# Patient Record
Sex: Male | Born: 1950
Health system: Southern US, Community
[De-identification: ages and names within clinical notes are randomized; demographics above are authoritative.]

## PROBLEM LIST (undated history)

## (undated) DIAGNOSIS — I2510 Atherosclerotic heart disease of native coronary artery without angina pectoris: Secondary | ICD-10-CM

## (undated) DIAGNOSIS — E119 Type 2 diabetes mellitus without complications: Secondary | ICD-10-CM

## (undated) DIAGNOSIS — K219 Gastro-esophageal reflux disease without esophagitis: Secondary | ICD-10-CM

## (undated) DIAGNOSIS — F419 Anxiety disorder, unspecified: Secondary | ICD-10-CM

## (undated) DIAGNOSIS — E7800 Pure hypercholesterolemia, unspecified: Secondary | ICD-10-CM

## (undated) HISTORY — PX: ORIF SHOULDER FRACTURE: SHX5035

## (undated) HISTORY — PX: PARS PLANA VITRECTOMY W/ REPAIR OF MACULAR HOLE: SHX2170

## (undated) HISTORY — PX: KNEE ARTHROSCOPY: SHX127

## (undated) HISTORY — PX: EYE SURGERY: SHX253

## (undated) HISTORY — PX: CATARACT EXTRACTION W/ INTRAOCULAR LENS  IMPLANT, BILATERAL: SHX1307

## (undated) HISTORY — PX: FRACTURE SURGERY: SHX138

---

## 1999-02-18 ENCOUNTER — Ambulatory Visit (HOSPITAL_COMMUNITY): Admission: RE | Admit: 1999-02-18 | Discharge: 1999-02-18 | Payer: Self-pay | Admitting: Internal Medicine

## 2003-05-13 ENCOUNTER — Ambulatory Visit (HOSPITAL_COMMUNITY): Admission: RE | Admit: 2003-05-13 | Discharge: 2003-05-13 | Payer: Self-pay | Admitting: Gastroenterology

## 2006-02-13 ENCOUNTER — Ambulatory Visit (HOSPITAL_COMMUNITY): Admission: RE | Admit: 2006-02-13 | Discharge: 2006-02-13 | Payer: Self-pay | Admitting: Ophthalmology

## 2006-04-28 ENCOUNTER — Encounter: Admission: RE | Admit: 2006-04-28 | Discharge: 2006-04-28 | Payer: Self-pay | Admitting: Internal Medicine

## 2007-10-15 ENCOUNTER — Ambulatory Visit (HOSPITAL_COMMUNITY): Admission: RE | Admit: 2007-10-15 | Discharge: 2007-10-15 | Payer: Self-pay | Admitting: Ophthalmology

## 2010-10-12 NOTE — Op Note (Signed)
NAMEAPRIL, CARLYON      ACCOUNT NO.:  [id number]   MEDICAL RECORD NO.:  [id number]          PATIENT TYPE:  AMB   LOCATION:  SDS                          FACILITY:  [facility]   PHYSICIAN:  Alford Highland. Rankin, M.D.   DATE OF BIRTH:  10/07/1950   DATE OF PROCEDURE:  10/15/2007  DATE OF DISCHARGE:  10/15/2007                               OPERATIVE REPORT   PREOPERATIVE DIAGNOSIS:  Macular hole - stage III left eye, status post  reopening and failed, primary surgery almost 1 year previous.   POSTOPERATIVE DIAGNOSIS:  Macular hole - stage III left eye, status post  reopening and failed, primary surgery almost 1 year previous.   PROCEDURE:  1. Posterior vitrectomy with internal limiting membrane peel with ICG      guidance left eye - 25 gauge.  2. Injection of vitreous substance C3F8 10% left eye.   SURGEON:  Alford Highland. Rankin, MD.   ANESTHESIA:  Local as per my anesthesia control.   INDICATIONS FOR PROCEDURE:  The patient is a 60 year old  male who has  profound vision loss in the left eye, which is chronic, persistent, and  debilitating in the left eye.  The patient states this is an attempt to  discover if there is any remnants of any internal-limiting membrane that  might be near the center of the vision and to peel these so that there  is hopefully enough contractility and enough spontaneous closure of the  macular hole after removal of the internal-limiting membrane was  confirmed to be completed.  The patient understands the risks of  anesthesia including the recurrence of death, loss of the eye including,  but not limited to hemorrhage, infection, scarring, need further  surgery, no change in vision, loss of vision, progressive disease  despite intervention.  Appropriate signed consent was obtained.   PROCEDURE IN DETAIL:  The patient was taken to the operating room.  In  the operating room, appropriate monitors followed by mild sedation.  Xylocaine 2% injected retrobulbar,  5 mL left eye, with additional 5 mL  laterally in the fashion of modified Darel Hong. The left periocular  region was then sterilely prepped and draped in the usual sterile  fashion. Lid speculum was applied.  A 25-gauge trocar was placed in the  inferotemporal quadrant.  The infusion turned on.  Superior trocars  applied.  Vitrectomy instrumentation applied and vitreous skirt was  trimmed further.  Fluid-air exchange completed almost immediately,  because the previous vitreous core had been removed.  Dilute 1 mL of ICG  with 6 mL of BSS was then placed over the posterior pole. This was  immediately aspirated and removed.  Staining of the internal limiting  membrane was noted at least over 1 disc diameter away from the edges of  the  macular hole 360 degrees.  However, when this was grasped nasally  and over the papillomacular bundle area of the continuous tear disclosed  that there may have been a small amount of internal limiting membrane  that encroached slightly closer to the fovea but no edges seem to be on  the edges of the macular hole or attached  to the macular hole.  There  may have been some remnants of cortical vitreous obscuring and staining  over the papillomacular bundle.  Nonetheless, the internal limiting  membrane was then peeled off between the entire foveal and optic nerve  region on the temporal aspect of the nerve and for another disc diameter  of 360 degrees circumferentially.  Leaping up over a 2-disc diameter  circle without internal limiting membrane.  At this time, fluid-air  exchange completed.  It must be noted that the all the dissection of  internal limiting membrane had been done under fluid.  Fluid exchange  now completed.  Under air, it is clear that the mobile edges of the  macular hole were noted.  Incision was made to place a long-acting C3F8  gas because of the patient's body habitus makes it difficult for  positioning.   An air-C3F8 10% exchange  completed.  The superior trocars were removed.  The infusion removed.  Instruments removed from the eye.  The infusion  removed. Subconjunctival with Decadron was applied.  Intraocular  pressures were assessed and were adequate.  Sterile patch and Fox shield  were applied.  The patient was taken to short-stay area to be discharged  home as an outpatient.      Alford Highland Rankin, M.D.  Electronically Signed     GAR/MEDQ  D:  10/15/2007  T:  10/16/2007  Job:  [id number]

## 2010-10-15 NOTE — Op Note (Signed)
Connor Wright, Connor Wright      ACCOUNT NO.:  [id number]   MEDICAL RECORD NO.:  [id number]          PATIENT TYPE:  AMB   LOCATION:  SDS                          FACILITY:  [facility]   PHYSICIAN:  Alford Highland. Rankin, M.D.   DATE OF BIRTH:  [birth year]   DATE OF PROCEDURE:  02/13/2006  DATE OF DISCHARGE:                                 OPERATIVE REPORT   PREOPERATIVE DIAGNOSES:  1. Macular hole--stage III, left eye.  2. Afferent membrane, left eye.   PROCEDURE:  1. Posterior vitrectomy with membrane peel--internal limiting membrane,      left eye--25 gauge.  2. Injection of vitreous substitute C3F8 8%.   SURGEON:  Alford Highland. Rankin, M.D.   ANESTHESIA:  Local retrobulbar, monitored anesthesia control.   INDICATIONS:  The patient is a 60 year old man who has __________ vision  loss on the basis of idiopathic stage III macular hole.  The patient  understands this is an attempt to close the macular hole by removing  tractional forces around the macular hole.  He understands the risks of  anesthesia including __________ the eye, including but not limited to  hemorrhage, infection, scarring, need fur further surgery, no change in  vision, loss of vision and progressive disease despite intervention.  Appropriate signed consent was obtained.  The patient was taken to the  operating room.   DESCRIPTION OF PROCEDURE:  In the operating room, appropriate monitors  followed by mild sedation.  Marcaine 0.75% delivered 5 mL retrobulbar to the  left eye followed by additional 5 mL, the latter in the fashion of a  modified [employer]  Left periocular region sterilely prepped and draped in  the usual ophthalmic fashion.  Lid speculum applied.  A 25-gauge trocar  system used to place the infusion cannula.  Superior trocars were applied.  Core vitrectomy was then begun.  Notable finding of physician-induced  vitreous detachment was required and this was carried and moved anterior to  the equator.  There  did appear to be slight posterior pressure from the  orbital tissues, but no choroidal elevations.  Nonetheless, most of the  performance of this procedure was done under higher infusion pressures to  maintain the globe contour.   At this time, a 25-gauge forceps was then used to engage the epiretinal  tissue, which was removed, and then subsequently the internal limiting  membrane was engaged temporal to the fovea and it was removed as a  continuous sheet off the macular region with excellent visualization.   At this time, no complications occurred.  Fluid-air exchange completed.  An  AR-C3F8 8% exchange completed.  Superior trocars were removed under high  infusion pressure to again maintain the globe integrity.  At this time, the  infusion was removed.  Subconjunctival injection of Decadron applied.  A  sterile patch and Fox shield applied.  The patient taken to the short stay,  discharged home as an outpatient.      Alford Highland Rankin, M.D.  Electronically Signed     GAR/MEDQ  D:  02/13/2006  T:  02/14/2006  Job:  [id number]

## 2011-06-21 ENCOUNTER — Ambulatory Visit (INDEPENDENT_AMBULATORY_CARE_PROVIDER_SITE_OTHER): Payer: BC Managed Care – PPO

## 2011-06-21 DIAGNOSIS — R05 Cough: Secondary | ICD-10-CM

## 2011-06-21 DIAGNOSIS — J40 Bronchitis, not specified as acute or chronic: Secondary | ICD-10-CM

## 2011-06-21 DIAGNOSIS — R059 Cough, unspecified: Secondary | ICD-10-CM

## 2012-06-06 ENCOUNTER — Ambulatory Visit (INDEPENDENT_AMBULATORY_CARE_PROVIDER_SITE_OTHER): Payer: BC Managed Care – PPO | Admitting: Family Medicine

## 2012-06-06 ENCOUNTER — Ambulatory Visit: Payer: BC Managed Care – PPO

## 2012-06-06 VITALS — BP 123/73 | HR 94 | Temp 97.7°F | Resp 16 | Ht 67.0 in | Wt 213.0 lb

## 2012-06-06 DIAGNOSIS — M5380 Other specified dorsopathies, site unspecified: Secondary | ICD-10-CM

## 2012-06-06 DIAGNOSIS — M545 Low back pain, unspecified: Secondary | ICD-10-CM

## 2012-06-06 DIAGNOSIS — M62830 Muscle spasm of back: Secondary | ICD-10-CM

## 2012-06-06 MED ORDER — MELOXICAM 7.5 MG PO TABS: 7.5000 mg | ORAL_TABLET | Freq: Every day | ORAL | Status: DC

## 2012-06-06 MED ORDER — CYCLOBENZAPRINE HCL 5 MG PO TABS: 5.0000 mg | ORAL_TABLET | Freq: Every evening | ORAL | Status: DC | PRN

## 2012-06-06 NOTE — Patient Instructions (Signed)
You likely sprained the ligaments or strained the muscles in you low back.  You can try heat, gentle range of motion and stretches, and other exercises in back care manual as needed.  meloxicam once per day if needed, flexeril at bedtime if needed. Recheck here or your primary doctor if not improving in next 1 week.   Return to the clinic or go to the nearest emergency room if any of your symptoms worsen or new symptoms occur.

## 2012-06-06 NOTE — Progress Notes (Signed)
Subjective:    Patient ID: Connor Wright, male    DOB: 02-Jul-1950, 62 y.o.   MRN: [id number]  HPI Connor Wright is a 62 y.o. male  LBP - occasional soreness - usually improves with ibuprofen otc, on and off pain for years, but more sore for past 3-4 days. Noticed initally when getting up from bed.  Hard to straighten.  Worse in the morning - less during day. No bowel or bladder incontinence, no saddle anesthesia, no lower extremity weakness. Muscles sore in back. [employer], computer/desk work. Did have fall few days prior - fell onto hands, no known back pain then - didn;t notice pain in back for 3-4days.    Tx:  Ibuprofen, naporsyn , heat - temporary relief only.   Dr.  Priscille Kluver = ortho, had xrays few years ago.   Hx of R shoulder pain followed by ortho in past as well, including cortisone injection.  Flairs at times, hasn' t had chance to follow up with Dr. Priscille Kluver.   Hx of   Review of Systems  Genitourinary: Negative for difficulty urinating.  Musculoskeletal: Positive for myalgias and back pain.  Skin: Negative for rash.       Objective:   Physical Exam  Constitutional: He appears well-developed and well-nourished.  HENT:  Head: Normocephalic and atraumatic.  Neck: Normal range of motion.  Pulmonary/Chest: Effort normal.  Abdominal: Soft. There is no tenderness.  Musculoskeletal: He exhibits tenderness.       Right shoulder: He exhibits normal range of motion (but pain with flex/abduction.) and no bony tenderness.       Lumbar back: He exhibits tenderness and spasm. He exhibits normal range of motion and no bony tenderness.       Back:       Arms: Neurological: He is alert. He has normal strength. No sensory deficit. He displays no Babinski's sign on the right side. He displays no Babinski's sign on the left side.  Reflex Scores:      Patellar reflexes are 2+ on the right side and 2+ on the left side.      Achilles reflexes are 2+ on the right side and 2+ on  the left side.      Able to heel and toe walk without difficulty.  Skin: Skin is warm and dry.  Psychiatric: He has a normal mood and affect. His behavior is normal.      [facility] reading (PRIMARY) by  Dr. Neva Seat: LS spine:decreased lordosis, min spondylosis.      Assessment & Plan:  Connor Wright is a 62 y.o. male 1. Low back pain  DG Lumbar Spine 2-3 Views, meloxicam (MOBIC) 7.5 MG tablet, cyclobenzaprine (FLEXERIL) 5 MG tablet. Likely chronic LBP with flair. Sx care, back care manual, rtc precautions.   2. Muscle spasm of back  cyclobenzaprine (FLEXERIL) 5 MG tablet   l R shoulder pain - recurrent bursitis likely - follow up with ortho.  May improve with meds for back.   Patient Instructions  You likely sprained the ligaments or strained the muscles in you low back.  You can try heat, gentle range of motion and stretches, and other exercises in back care manual as needed.  meloxicam once per day if needed, flexeril at bedtime if needed. Recheck here or your primary doctor if not improving in next 1 week.   Return to the clinic or go to the nearest emergency room if any of your symptoms worsen or new symptoms occur.

## 2014-07-02 IMAGING — CR DG LUMBAR SPINE 2-3V
2 series · 2 of 2 positions shown · non-contrast
Comparison: None.

CLINICAL DATA: Low back pain

LUMBAR SPINE - 2-3 VIEW

[AP]
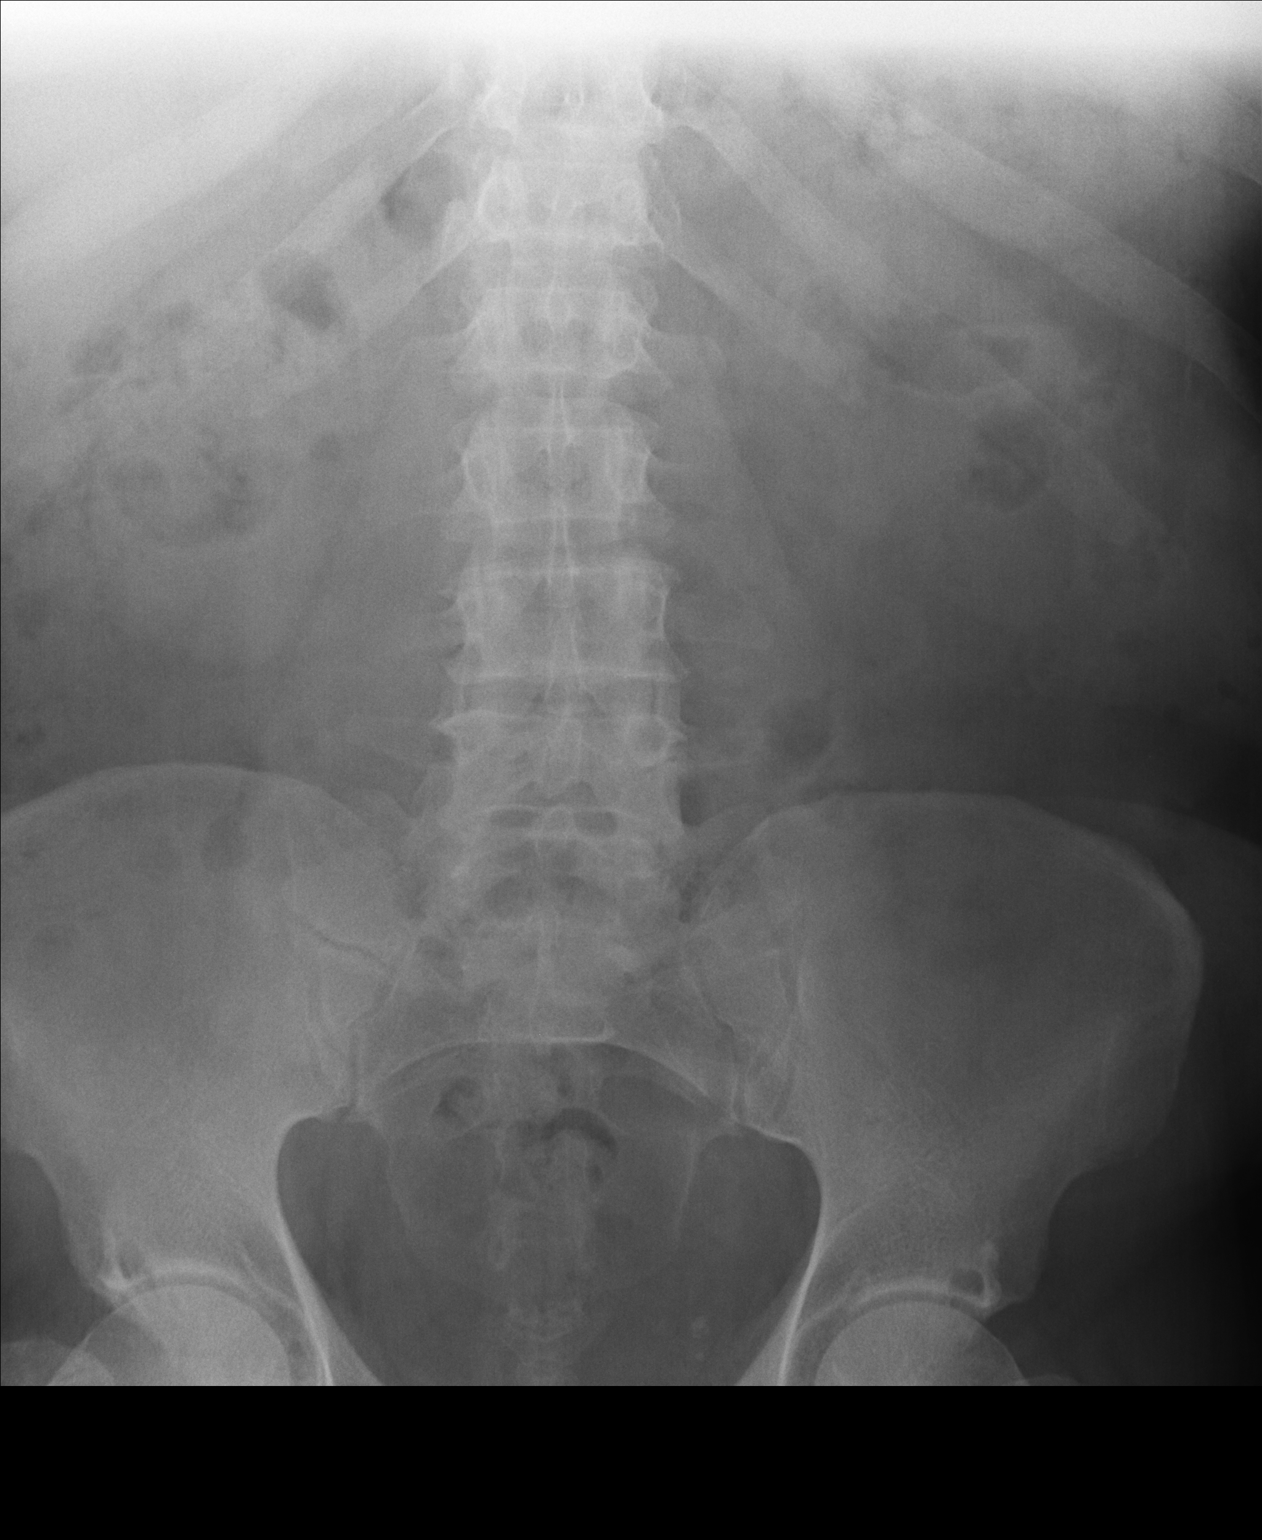

[lateral]
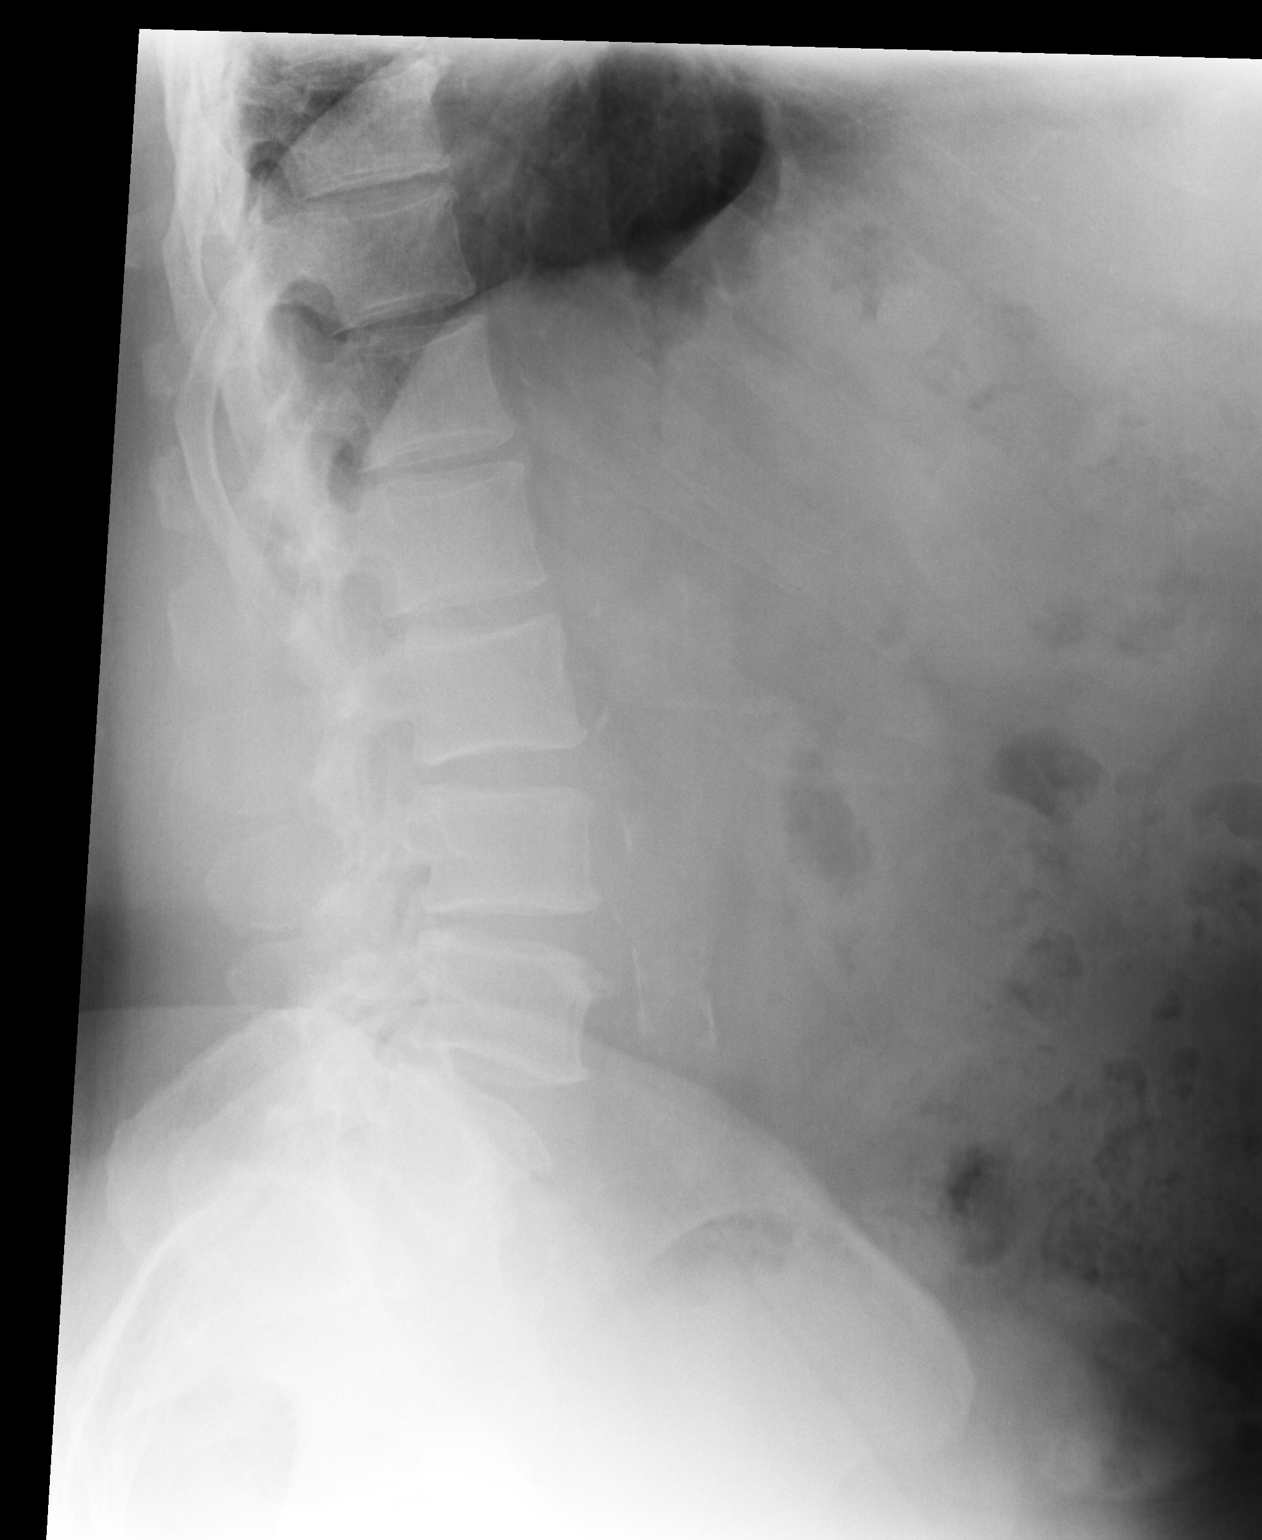

[2 of 2 positions shown; findings below may reference images not displayed]

FINDINGS: Two views of the lumbar spine submitted.  No acute
fracture or subluxation.  Mild anterior spurring noted upper and
lower endplate of the L4 and L5 vertebral body.  Mild
atherosclerotic calcifications of the abdominal aorta.
IMPRESSION: No acute fracture or subluxation.  Mild degenerative changes.

## 2016-04-11 DIAGNOSIS — R799 Abnormal finding of blood chemistry, unspecified: Secondary | ICD-10-CM | POA: Diagnosis not present

## 2016-04-11 DIAGNOSIS — Z125 Encounter for screening for malignant neoplasm of prostate: Secondary | ICD-10-CM | POA: Diagnosis not present

## 2016-04-11 DIAGNOSIS — D559 Anemia due to enzyme disorder, unspecified: Secondary | ICD-10-CM | POA: Diagnosis not present

## 2016-04-11 DIAGNOSIS — E1165 Type 2 diabetes mellitus with hyperglycemia: Secondary | ICD-10-CM | POA: Diagnosis not present

## 2016-04-11 DIAGNOSIS — D509 Iron deficiency anemia, unspecified: Secondary | ICD-10-CM | POA: Diagnosis not present

## 2016-04-11 DIAGNOSIS — Z0000 Encounter for general adult medical examination without abnormal findings: Secondary | ICD-10-CM | POA: Diagnosis not present

## 2016-04-11 DIAGNOSIS — E109 Type 1 diabetes mellitus without complications: Secondary | ICD-10-CM | POA: Diagnosis not present

## 2016-06-13 DIAGNOSIS — I119 Hypertensive heart disease without heart failure: Secondary | ICD-10-CM | POA: Diagnosis not present

## 2016-06-13 DIAGNOSIS — H6120 Impacted cerumen, unspecified ear: Secondary | ICD-10-CM | POA: Diagnosis not present

## 2016-06-13 DIAGNOSIS — E1165 Type 2 diabetes mellitus with hyperglycemia: Secondary | ICD-10-CM | POA: Diagnosis not present

## 2016-06-20 DIAGNOSIS — E119 Type 2 diabetes mellitus without complications: Secondary | ICD-10-CM | POA: Diagnosis not present

## 2016-06-22 DIAGNOSIS — M65341 Trigger finger, right ring finger: Secondary | ICD-10-CM | POA: Diagnosis not present

## 2016-08-02 DIAGNOSIS — M65341 Trigger finger, right ring finger: Secondary | ICD-10-CM | POA: Diagnosis not present

## 2016-09-06 DIAGNOSIS — H903 Sensorineural hearing loss, bilateral: Secondary | ICD-10-CM | POA: Diagnosis not present

## 2016-09-06 DIAGNOSIS — H9113 Presbycusis, bilateral: Secondary | ICD-10-CM | POA: Diagnosis not present

## 2016-09-06 DIAGNOSIS — H9313 Tinnitus, bilateral: Secondary | ICD-10-CM | POA: Diagnosis not present

## 2017-05-02 DIAGNOSIS — Z125 Encounter for screening for malignant neoplasm of prostate: Secondary | ICD-10-CM | POA: Diagnosis not present

## 2017-05-02 DIAGNOSIS — E119 Type 2 diabetes mellitus without complications: Secondary | ICD-10-CM | POA: Diagnosis not present

## 2017-05-02 DIAGNOSIS — E118 Type 2 diabetes mellitus with unspecified complications: Secondary | ICD-10-CM | POA: Diagnosis not present

## 2017-05-02 DIAGNOSIS — I10 Essential (primary) hypertension: Secondary | ICD-10-CM | POA: Diagnosis not present

## 2017-05-02 DIAGNOSIS — D649 Anemia, unspecified: Secondary | ICD-10-CM | POA: Diagnosis not present

## 2017-05-02 DIAGNOSIS — Z1211 Encounter for screening for malignant neoplasm of colon: Secondary | ICD-10-CM | POA: Diagnosis not present

## 2017-05-29 DIAGNOSIS — J40 Bronchitis, not specified as acute or chronic: Secondary | ICD-10-CM | POA: Diagnosis not present

## 2017-05-29 DIAGNOSIS — I10 Essential (primary) hypertension: Secondary | ICD-10-CM | POA: Diagnosis not present

## 2017-10-31 DIAGNOSIS — R079 Chest pain, unspecified: Secondary | ICD-10-CM | POA: Diagnosis not present

## 2017-11-01 ENCOUNTER — Ambulatory Visit: Payer: PPO | Admitting: Cardiovascular Disease

## 2017-11-01 ENCOUNTER — Encounter: Payer: Self-pay | Admitting: Cardiovascular Disease

## 2017-11-01 DIAGNOSIS — I10 Essential (primary) hypertension: Secondary | ICD-10-CM | POA: Diagnosis not present

## 2017-11-01 DIAGNOSIS — I208 Other forms of angina pectoris: Secondary | ICD-10-CM | POA: Insufficient documentation

## 2017-11-01 DIAGNOSIS — Z8249 Family history of ischemic heart disease and other diseases of the circulatory system: Secondary | ICD-10-CM | POA: Diagnosis not present

## 2017-11-01 DIAGNOSIS — E7800 Pure hypercholesterolemia, unspecified: Secondary | ICD-10-CM | POA: Diagnosis not present

## 2017-11-01 DIAGNOSIS — E785 Hyperlipidemia, unspecified: Secondary | ICD-10-CM | POA: Insufficient documentation

## 2017-11-01 NOTE — H&P (View-Only) (Signed)
11/01/2017 Connor Wright   02/07/1951  [id number]  Primary Physician Levin Erp, MD Primary Cardiologist: Lorretta Harp MD [name], [place]  HPI:  Connor Wright is a 67 y.o. 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  He was referred by Dr. Nyoka Cowden for cardiovascular evaluation because of new onset dyspnea and chest pressure.  Risk factors include 40 pack years of tobacco abuse having quit 12 years ago, treated diabetes and hyperlipidemia as well as family history of heart disease with 2 brothers who had stents and bypass surgery.  He is never had a heart attack or stroke.  He is noticed increasing dyspnea on exertion and chest pressure with walking up a hill in the last several weeks which sounds fairly typical for angina.   Current Meds  Medication Sig  . ALPRAZolam (XANAX) 0.5 MG tablet Take 0.5 mg by mouth 3 (three) times daily as needed.  Marland Kitchen aspirin 81 MG tablet Take 81 mg by mouth daily.  Marland Kitchen atorvastatin (LIPITOR) 80 MG tablet Take 80 mg by mouth daily.  . cyclobenzaprine (FLEXERIL) 5 MG tablet Take 1 tablet (5 mg total) by mouth at bedtime as needed for muscle spasms.  . meloxicam (MOBIC) 7.5 MG tablet Take 1 tablet (7.5 mg total) by mouth daily.  Marland Kitchen omeprazole (PRILOSEC) 20 MG capsule Take 20 mg by mouth daily.  . sitaGLIPtan-metformin (JANUMET) 50-500 MG per tablet Take 1 tablet by mouth 2 (two) times daily with a meal.     No Known Allergies  Social History   Socioeconomic History  . Marital status: Married    Spouse name: Not on file  . Number of children: Not on file  . Years of education: Not on file  . Highest education level: Not on file  Occupational History  . Not on file  Social Needs  . Financial resource strain: Not on file  . Food insecurity:    Worry: Not on file    Inability: Not on file  . Transportation needs:    Medical: Not on file    Non-medical: Not on file  Tobacco Use    . Smoking status: Never Smoker  . Smokeless tobacco: Never Used  Substance and Sexual Activity  . Alcohol use: No  . Drug use: No  . Sexual activity: Not on file  Lifestyle  . Physical activity:    Days per week: Not on file    Minutes per session: Not on file  . Stress: Not on file  Relationships  . Social connections:    Talks on phone: Not on file    Gets together: Not on file    Attends religious service: Not on file    Active member of club or organization: Not on file    Attends meetings of clubs or organizations: Not on file    Relationship status: Not on file  . Intimate partner violence:    Fear of current or ex partner: Not on file    Emotionally abused: Not on file    Physically abused: Not on file    Forced sexual activity: Not on file  Other Topics Concern  . Not on file  Social History Narrative  . Not on file     Review of Systems: General: negative for chills, fever, night sweats or weight changes.  Cardiovascular: negative for chest pain, dyspnea on exertion, edema, orthopnea, palpitations, paroxysmal nocturnal dyspnea or shortness of breath Dermatological: negative for rash  Respiratory: negative for cough or wheezing Urologic: negative for hematuria Abdominal: negative for nausea, vomiting, diarrhea, bright red blood per rectum, melena, or hematemesis Neurologic: negative for visual changes, syncope, or dizziness All other systems reviewed and are otherwise negative except as noted above.    Blood pressure (!) 142/86, pulse 90, height 5\' 7"  (1.702 m), weight 216 lb (98 kg).  General appearance: alert and no distress Neck: no adenopathy, no carotid bruit, no JVD, supple, symmetrical, trachea midline and thyroid not enlarged, symmetric, no tenderness/mass/nodules Lungs: clear to auscultation bilaterally Heart: regular rate and rhythm, S1, S2 normal, no murmur, click, rub or gallop Extremities: extremities normal, atraumatic, no cyanosis or  edema Pulses: 2+ and symmetric Skin: Skin color, texture, turgor normal. No rashes or lesions Neurologic: Alert and oriented X 3, normal strength and tone. Normal symmetric reflexes. Normal coordination and gait  EKG not performed today  ASSESSMENT AND PLAN:   Hyperlipidemia History of hyperlipidemia on statin therapy  Chest pain History of new onset exertional chest pressure and dyspnea.  His risk factors hyperlipidemia and diabetes as well as family history.  Symptoms are fairly convincing reproducible.  I decided to proceed with outpatient diagnostic radial cardiac catheterization next Thursday. The patient understands that risks included but are not limited to stroke (1 in 1000), death (1 in 62), kidney failure [usually temporary] (1 in 500), bleeding (1 in 200), allergic reaction [possibly serious] (1 in 200). The patient understands and agrees to proceed      Lorretta Harp MD [facility], [place] [facility] 11/01/2017 11:46 AM

## 2017-11-01 NOTE — Assessment & Plan Note (Signed)
History of hyperlipidemia on statin therapy. 

## 2017-11-01 NOTE — Assessment & Plan Note (Signed)
History of new onset exertional chest pressure and dyspnea.  His risk factors hyperlipidemia and diabetes as well as family history.  Symptoms are fairly convincing reproducible.  I decided to proceed with outpatient diagnostic radial cardiac catheterization next Thursday. The patient understands that risks included but are not limited to stroke (1 in 1000), death (1 in 3), kidney failure [usually temporary] (1 in 500), bleeding (1 in 200), allergic reaction [possibly serious] (1 in 200). The patient understands and agrees to proceed

## 2017-11-01 NOTE — Patient Instructions (Signed)
   [facility] [address] [place] [address] Dept: [phone] Loc: [facility]  11/01/2017  You are scheduled for a Cardiac Catheterization on Thursday, June 13 with Dr. Quay Burow.  1. Please arrive at the [facility] (Main Entrance A) at [facility]: [address] [place], [place] [address] at 9:30 AM (two hours before your procedure to ensure your preparation). Free valet parking service is available.   Special note: Every effort is made to have your procedure done on time. Please understand that emergencies sometimes delay scheduled procedures.  2. Diet: Do not eat or drink anything after midnight prior to your procedure except sips of water to take medications.  3. Labs: Please have pre-procedure lab work drawn in our office today.  4. Medication instructions in preparation for your procedure:  Stop taking, Janumet (Metformin + Sitagliptin) on Wednesday, June 12.    On the morning of your procedure, take your Aspirin and any morning medicines NOT listed above.  You may use sips of water.  5. Plan for one night stay--bring personal belongings. 6. Bring a current list of your medications and current insurance cards. 7. You MUST have a responsible person to drive you home. 8. Someone MUST be with you the first 24 hours after you arrive home or your discharge will be delayed. 9. Please wear clothes that are easy to get on and off and wear slip-on shoes.  Thank you for allowing [place] to care for you!   -- [facility]

## 2017-11-01 NOTE — Progress Notes (Signed)
11/01/2017 Connor Wright   [birth year]  [id number]  Primary Physician Levin Erp, MD Primary Cardiologist: Lorretta Harp MD [name], [place]  HPI:  Connor Wright is a 67 y.o. 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  He was referred by Dr. Nyoka Cowden for cardiovascular evaluation because of new onset dyspnea and chest pressure.  Risk factors include 40 pack years of tobacco abuse having quit 12 years ago, treated diabetes and hyperlipidemia as well as family history of heart disease with 2 brothers who had stents and bypass surgery.  He is never had a heart attack or stroke.  He is noticed increasing dyspnea on exertion and chest pressure with walking up a hill in the last several weeks which sounds fairly typical for angina.   Current Meds  Medication Sig  . ALPRAZolam (XANAX) 0.5 MG tablet Take 0.5 mg by mouth 3 (three) times daily as needed.  Marland Kitchen aspirin 81 MG tablet Take 81 mg by mouth daily.  Marland Kitchen atorvastatin (LIPITOR) 80 MG tablet Take 80 mg by mouth daily.  . cyclobenzaprine (FLEXERIL) 5 MG tablet Take 1 tablet (5 mg total) by mouth at bedtime as needed for muscle spasms.  . meloxicam (MOBIC) 7.5 MG tablet Take 1 tablet (7.5 mg total) by mouth daily.  Marland Kitchen omeprazole (PRILOSEC) 20 MG capsule Take 20 mg by mouth daily.  . sitaGLIPtan-metformin (JANUMET) 50-500 MG per tablet Take 1 tablet by mouth 2 (two) times daily with a meal.     No Known Allergies  Social History   Socioeconomic History  . Marital status: Married    Spouse name: Not on file  . Number of children: Not on file  . Years of education: Not on file  . Highest education level: Not on file  Occupational History  . Not on file  Social Needs  . Financial resource strain: Not on file  . Food insecurity:    Worry: Not on file    Inability: Not on file  . Transportation needs:    Medical: Not on file    Non-medical: Not on file  Tobacco Use    . Smoking status: Never Smoker  . Smokeless tobacco: Never Used  Substance and Sexual Activity  . Alcohol use: No  . Drug use: No  . Sexual activity: Not on file  Lifestyle  . Physical activity:    Days per week: Not on file    Minutes per session: Not on file  . Stress: Not on file  Relationships  . Social connections:    Talks on phone: Not on file    Gets together: Not on file    Attends religious service: Not on file    Active member of club or organization: Not on file    Attends meetings of clubs or organizations: Not on file    Relationship status: Not on file  . Intimate partner violence:    Fear of current or ex partner: Not on file    Emotionally abused: Not on file    Physically abused: Not on file    Forced sexual activity: Not on file  Other Topics Concern  . Not on file  Social History Narrative  . Not on file     Review of Systems: General: negative for chills, fever, night sweats or weight changes.  Cardiovascular: negative for chest pain, dyspnea on exertion, edema, orthopnea, palpitations, paroxysmal nocturnal dyspnea or shortness of breath Dermatological: negative for rash  Respiratory: negative for cough or wheezing Urologic: negative for hematuria Abdominal: negative for nausea, vomiting, diarrhea, bright red blood per rectum, melena, or hematemesis Neurologic: negative for visual changes, syncope, or dizziness All other systems reviewed and are otherwise negative except as noted above.    Blood pressure (!) 142/86, pulse 90, height 5\' 7"  (1.702 m), weight 216 lb (98 kg).  General appearance: alert and no distress Neck: no adenopathy, no carotid bruit, no JVD, supple, symmetrical, trachea midline and thyroid not enlarged, symmetric, no tenderness/mass/nodules Lungs: clear to auscultation bilaterally Heart: regular rate and rhythm, S1, S2 normal, no murmur, click, rub or gallop Extremities: extremities normal, atraumatic, no cyanosis or  edema Pulses: 2+ and symmetric Skin: Skin color, texture, turgor normal. No rashes or lesions Neurologic: Alert and oriented X 3, normal strength and tone. Normal symmetric reflexes. Normal coordination and gait  EKG not performed today  ASSESSMENT AND PLAN:   Hyperlipidemia History of hyperlipidemia on statin therapy  Chest pain History of new onset exertional chest pressure and dyspnea.  His risk factors hyperlipidemia and diabetes as well as family history.  Symptoms are fairly convincing reproducible.  I decided to proceed with outpatient diagnostic radial cardiac catheterization next Thursday. The patient understands that risks included but are not limited to stroke (1 in 1000), death (1 in 62), kidney failure [usually temporary] (1 in 500), bleeding (1 in 200), allergic reaction [possibly serious] (1 in 200). The patient understands and agrees to proceed      Lorretta Harp MD [facility], [facility] 11/01/2017 11:46 AM

## 2017-11-02 LAB — BASIC METABOLIC PANEL
BUN/Creatinine Ratio: 15 (ref 10–24)
BUN: 12 mg/dL (ref 8–27)
CALCIUM: 9.9 mg/dL (ref 8.6–10.2)
CO2: 24 mmol/L (ref 20–29)
Chloride: 101 mmol/L (ref 96–106)
Creatinine, Ser: 0.78 mg/dL (ref 0.76–1.27)
GFR, EST AFRICAN AMERICAN: 108 mL/min/{1.73_m2} (ref >59)
GFR, EST NON AFRICAN AMERICAN: 93 mL/min/{1.73_m2} (ref >59)
Glucose: 91 mg/dL (ref 65–99)
POTASSIUM: 4.7 mmol/L (ref 3.5–5.2)
Sodium: 141 mmol/L (ref 134–144)

## 2017-11-02 LAB — CBC WITH DIFFERENTIAL/PLATELET
BASOS: 0 %
Basophils Absolute: 0 10*3/uL (ref 0.0–0.2)
EOS (ABSOLUTE): 0.2 10*3/uL (ref 0.0–0.4)
EOS: 2 %
HEMATOCRIT: 41.2 % (ref 37.5–51.0)
Hemoglobin: 13.6 g/dL (ref 13.0–17.7)
IMMATURE GRANS (ABS): 0 10*3/uL (ref 0.0–0.1)
Immature Granulocytes: 0 %
LYMPHS: 30 %
Lymphocytes Absolute: 2.4 10*3/uL (ref 0.7–3.1)
MCH: 29.4 pg (ref 26.6–33.0)
MCHC: 33 g/dL (ref 31.5–35.7)
MCV: 89 fL (ref 79–97)
MONOCYTES: 10 %
Monocytes Absolute: 0.8 10*3/uL (ref 0.1–0.9)
NEUTROS ABS: 4.6 10*3/uL (ref 1.4–7.0)
Neutrophils: 58 %
Platelets: 351 10*3/uL (ref 150–450)
RBC: 4.63 x10E6/uL (ref 4.14–5.80)
RDW: 13.8 % (ref 12.3–15.4)
WBC: 7.9 10*3/uL (ref 3.4–10.8)

## 2017-11-02 LAB — APTT: aPTT: 26 s (ref 24–33)

## 2017-11-02 LAB — PROTIME-INR
INR: 1 (ref 0.8–1.2)
Prothrombin Time: 10.5 s (ref 9.1–12.0)

## 2017-11-08 ENCOUNTER — Telehealth: Payer: Self-pay | Admitting: *Deleted

## 2017-11-08 NOTE — Telephone Encounter (Signed)
Pt contacted pre-catheterization scheduled at [facility] for: Thursday November 09, 2017 11:30 AM Verified arrival time and place: [place] Entrance A at: 9:30 AM  No solid food after midnight prior to cath, clear liquids until 5 AM day of procedure. Verified no known allergies.   Hold: Metformin 11/09/17 and 48 hours post procedure.  Except hold medications AM meds can be  taken pre-cath with sip of water including: ASA 81 mg  Confirmed patient has responsible person to drive home post procedure and observe patient for 24 hours: yes

## 2017-11-09 ENCOUNTER — Encounter (HOSPITAL_COMMUNITY): Payer: Self-pay | Admitting: General Practice

## 2017-11-09 ENCOUNTER — Other Ambulatory Visit: Payer: Self-pay

## 2017-11-09 ENCOUNTER — Ambulatory Visit (HOSPITAL_COMMUNITY)
Admission: RE | Disposition: A | Discharge status: Home / Self Care | Payer: Self-pay | Source: Ambulatory Visit | Attending: Cardiovascular Disease

## 2017-11-09 ENCOUNTER — Ambulatory Visit (HOSPITAL_COMMUNITY)
Admission: RE | Admit: 2017-11-09 | Discharge: 2017-11-10 | Disposition: A | Discharge status: Home / Self Care | Payer: PPO | Source: Ambulatory Visit | Attending: Cardiovascular Disease | Admitting: Cardiovascular Disease

## 2017-11-09 DIAGNOSIS — I25118 Atherosclerotic heart disease of native coronary artery with other forms of angina pectoris: Secondary | ICD-10-CM | POA: Diagnosis not present

## 2017-11-09 DIAGNOSIS — I2583 Coronary atherosclerosis due to lipid rich plaque: Secondary | ICD-10-CM | POA: Insufficient documentation

## 2017-11-09 DIAGNOSIS — I2510 Atherosclerotic heart disease of native coronary artery without angina pectoris: Secondary | ICD-10-CM | POA: Diagnosis present

## 2017-11-09 DIAGNOSIS — E785 Hyperlipidemia, unspecified: Secondary | ICD-10-CM | POA: Diagnosis not present

## 2017-11-09 DIAGNOSIS — I10 Essential (primary) hypertension: Secondary | ICD-10-CM | POA: Diagnosis not present

## 2017-11-09 DIAGNOSIS — E119 Type 2 diabetes mellitus without complications: Secondary | ICD-10-CM | POA: Insufficient documentation

## 2017-11-09 DIAGNOSIS — Z7982 Long term (current) use of aspirin: Secondary | ICD-10-CM | POA: Insufficient documentation

## 2017-11-09 DIAGNOSIS — E663 Overweight: Secondary | ICD-10-CM | POA: Diagnosis not present

## 2017-11-09 DIAGNOSIS — Z87891 Personal history of nicotine dependence: Secondary | ICD-10-CM | POA: Diagnosis not present

## 2017-11-09 DIAGNOSIS — Z7984 Long term (current) use of oral hypoglycemic drugs: Secondary | ICD-10-CM | POA: Insufficient documentation

## 2017-11-09 DIAGNOSIS — Z6834 Body mass index (BMI) 34.0-34.9, adult: Secondary | ICD-10-CM | POA: Diagnosis not present

## 2017-11-09 DIAGNOSIS — I2089 Other forms of angina pectoris: Secondary | ICD-10-CM | POA: Diagnosis present

## 2017-11-09 DIAGNOSIS — Z8249 Family history of ischemic heart disease and other diseases of the circulatory system: Secondary | ICD-10-CM | POA: Diagnosis not present

## 2017-11-09 DIAGNOSIS — I208 Other forms of angina pectoris: Secondary | ICD-10-CM | POA: Diagnosis present

## 2017-11-09 DIAGNOSIS — Z9861 Coronary angioplasty status: Secondary | ICD-10-CM

## 2017-11-09 DIAGNOSIS — Z955 Presence of coronary angioplasty implant and graft: Secondary | ICD-10-CM

## 2017-11-09 HISTORY — PX: LEFT HEART CATH AND CORONARY ANGIOGRAPHY: CATH118249

## 2017-11-09 HISTORY — DX: Gastro-esophageal reflux disease without esophagitis: K21.9

## 2017-11-09 HISTORY — DX: Atherosclerotic heart disease of native coronary artery without angina pectoris: I25.10

## 2017-11-09 HISTORY — DX: Type 2 diabetes mellitus without complications: E11.9

## 2017-11-09 HISTORY — PX: CORONARY ANGIOPLASTY WITH STENT PLACEMENT: SHX49

## 2017-11-09 HISTORY — DX: Anxiety disorder, unspecified: F41.9

## 2017-11-09 HISTORY — PX: CORONARY STENT INTERVENTION: CATH118234

## 2017-11-09 HISTORY — DX: Pure hypercholesterolemia, unspecified: E78.00

## 2017-11-09 LAB — GLUCOSE, CAPILLARY
Glucose-Capillary: 107 mg/dL — ABNORMAL HIGH (ref 65–99)
Glucose-Capillary: 102 mg/dL — ABNORMAL HIGH (ref 65–99)
Glucose-Capillary: 97 mg/dL (ref 65–99)
Glucose-Capillary: 105 mg/dL — ABNORMAL HIGH (ref 65–99)

## 2017-11-09 LAB — POCT ACTIVATED CLOTTING TIME: Activated Clotting Time: 450 seconds

## 2017-11-09 SURGERY — LEFT HEART CATH AND CORONARY ANGIOGRAPHY
Anesthesia: LOCAL

## 2017-11-09 MED ORDER — ATORVASTATIN CALCIUM 80 MG PO TABS
80.0000 mg | ORAL_TABLET | Freq: Every day | ORAL | Status: DC
  Administered 2017-11-09: 80 mg via ORAL
  Filled 2017-11-09: qty 1

## 2017-11-09 MED ORDER — SODIUM CHLORIDE 0.9 % IV SOLN: 250.0000 mL | INTRAVENOUS | Status: DC | PRN

## 2017-11-09 MED ORDER — VERAPAMIL HCL 2.5 MG/ML IV SOLN
INTRAVENOUS | Status: AC
  Filled 2017-11-09: qty 2

## 2017-11-09 MED ORDER — BIVALIRUDIN TRIFLUOROACETATE 250 MG IV SOLR
INTRAVENOUS | Status: AC
  Filled 2017-11-09: qty 250

## 2017-11-09 MED ORDER — LIDOCAINE HCL (PF) 1 % IJ SOLN
INTRAMUSCULAR | Status: AC
  Filled 2017-11-09: qty 30

## 2017-11-09 MED ORDER — SODIUM CHLORIDE 0.9% FLUSH: 3.0000 mL | INTRAVENOUS | Status: DC | PRN

## 2017-11-09 MED ORDER — CLOPIDOGREL BISULFATE 75 MG PO TABS
75.0000 mg | ORAL_TABLET | Freq: Every day | ORAL | Status: DC
  Administered 2017-11-10: 10:00:00 75 mg via ORAL
  Filled 2017-11-09: qty 1

## 2017-11-09 MED ORDER — MIDAZOLAM HCL 2 MG/2ML IJ SOLN
INTRAMUSCULAR | Status: AC
  Filled 2017-11-09: qty 2

## 2017-11-09 MED ORDER — HEPARIN (PORCINE) IN NACL 2-0.9 UNITS/ML
INTRAMUSCULAR | Status: AC | PRN
  Administered 2017-11-09: 1000 mL

## 2017-11-09 MED ORDER — SODIUM CHLORIDE 0.9 % IV SOLN
1.7500 mg/kg/h | INTRAVENOUS | Status: AC
  Filled 2017-11-09: qty 250

## 2017-11-09 MED ORDER — CLOPIDOGREL BISULFATE 300 MG PO TABS
ORAL_TABLET | ORAL | Status: DC | PRN
  Administered 2017-11-09: 600 mg via ORAL

## 2017-11-09 MED ORDER — CLOPIDOGREL BISULFATE 300 MG PO TABS
ORAL_TABLET | ORAL | Status: AC
  Filled 2017-11-09: qty 2

## 2017-11-09 MED ORDER — HEPARIN (PORCINE) IN NACL 1000-0.9 UT/500ML-% IV SOLN
INTRAVENOUS | Status: AC
  Filled 2017-11-09: qty 1000

## 2017-11-09 MED ORDER — NITROGLYCERIN 1 MG/10 ML FOR IR/CATH LAB
INTRA_ARTERIAL | Status: AC
  Filled 2017-11-09: qty 10

## 2017-11-09 MED ORDER — SODIUM CHLORIDE 0.9% FLUSH: 3.0000 mL | Freq: Two times a day (BID) | INTRAVENOUS | Status: DC

## 2017-11-09 MED ORDER — HYDRALAZINE HCL 20 MG/ML IJ SOLN: 5.0000 mg | INTRAMUSCULAR | Status: AC | PRN

## 2017-11-09 MED ORDER — LIDOCAINE HCL (PF) 1 % IJ SOLN
INTRAMUSCULAR | Status: DC | PRN
  Administered 2017-11-09: 2 mL via INTRADERMAL

## 2017-11-09 MED ORDER — VERAPAMIL HCL 2.5 MG/ML IV SOLN
INTRAVENOUS | Status: DC | PRN
  Administered 2017-11-09 (×2): 5 mL via INTRA_ARTERIAL

## 2017-11-09 MED ORDER — SODIUM CHLORIDE 0.9 % WEIGHT BASED INFUSION
3.0000 mL/kg/h | INTRAVENOUS | Status: DC
  Administered 2017-11-09: 3 mL/kg/h via INTRAVENOUS

## 2017-11-09 MED ORDER — MIDAZOLAM HCL 2 MG/2ML IJ SOLN
INTRAMUSCULAR | Status: DC | PRN
  Administered 2017-11-09: 1 mg via INTRAVENOUS

## 2017-11-09 MED ORDER — ONDANSETRON HCL 4 MG/2ML IJ SOLN: 4.0000 mg | Freq: Four times a day (QID) | INTRAMUSCULAR | Status: DC | PRN

## 2017-11-09 MED ORDER — HEPARIN SODIUM (PORCINE) 1000 UNIT/ML IJ SOLN
INTRAMUSCULAR | Status: AC
  Filled 2017-11-09: qty 1

## 2017-11-09 MED ORDER — PANTOPRAZOLE SODIUM 40 MG PO TBEC
40.0000 mg | DELAYED_RELEASE_TABLET | Freq: Every day | ORAL | Status: DC
  Administered 2017-11-10: 40 mg via ORAL
  Filled 2017-11-09: qty 1

## 2017-11-09 MED ORDER — ACETAMINOPHEN 325 MG PO TABS
650.0000 mg | ORAL_TABLET | ORAL | Status: DC | PRN
  Administered 2017-11-10: 650 mg via ORAL
  Filled 2017-11-09: qty 2

## 2017-11-09 MED ORDER — SODIUM CHLORIDE 0.9 % WEIGHT BASED INFUSION: 1.0000 mL/kg/h | INTRAVENOUS | Status: DC

## 2017-11-09 MED ORDER — IOHEXOL 350 MG/ML SOLN
INTRAVENOUS | Status: DC | PRN
  Administered 2017-11-09: 115 mL via INTRA_ARTERIAL

## 2017-11-09 MED ORDER — TAMSULOSIN HCL 0.4 MG PO CAPS
0.4000 mg | ORAL_CAPSULE | Freq: Every evening | ORAL | Status: DC
  Administered 2017-11-09: 17:00:00 0.4 mg via ORAL
  Filled 2017-11-09: qty 1

## 2017-11-09 MED ORDER — METOPROLOL SUCCINATE ER 50 MG PO TB24
50.0000 mg | ORAL_TABLET | Freq: Every day | ORAL | Status: DC
  Administered 2017-11-10: 10:00:00 50 mg via ORAL
  Filled 2017-11-09: qty 1

## 2017-11-09 MED ORDER — ALPRAZOLAM 0.25 MG PO TABS
0.2500 mg | ORAL_TABLET | Freq: Three times a day (TID) | ORAL | Status: DC
  Administered 2017-11-09 – 2017-11-10 (×3): 0.25 mg via ORAL
  Filled 2017-11-09 (×3): qty 1

## 2017-11-09 MED ORDER — HEPARIN SODIUM (PORCINE) 1000 UNIT/ML IJ SOLN
INTRAMUSCULAR | Status: DC | PRN
  Administered 2017-11-09: 5000 [IU] via INTRAVENOUS

## 2017-11-09 MED ORDER — ASPIRIN 81 MG PO CHEW
81.0000 mg | CHEWABLE_TABLET | Freq: Every day | ORAL | Status: DC
  Administered 2017-11-10: 81 mg via ORAL
  Filled 2017-11-09: qty 1

## 2017-11-09 MED ORDER — NITROGLYCERIN 0.4 MG SL SUBL: 0.4000 mg | SUBLINGUAL_TABLET | SUBLINGUAL | Status: DC | PRN

## 2017-11-09 MED ORDER — MORPHINE SULFATE (PF) 2 MG/ML IV SOLN: 2.0000 mg | INTRAVENOUS | Status: DC | PRN

## 2017-11-09 MED ORDER — SODIUM CHLORIDE 0.9 % IV SOLN
INTRAVENOUS | Status: AC | PRN
  Administered 2017-11-09 (×2): 1.75 mg/kg/h via INTRAVENOUS

## 2017-11-09 MED ORDER — SODIUM CHLORIDE 0.9 % IV SOLN: INTRAVENOUS | Status: AC

## 2017-11-09 MED ORDER — FENTANYL CITRATE (PF) 100 MCG/2ML IJ SOLN
INTRAMUSCULAR | Status: AC
  Filled 2017-11-09: qty 2

## 2017-11-09 MED ORDER — FAMOTIDINE IN NACL 20-0.9 MG/50ML-% IV SOLN
INTRAVENOUS | Status: AC
  Filled 2017-11-09: qty 50

## 2017-11-09 MED ORDER — FAMOTIDINE IN NACL 20-0.9 MG/50ML-% IV SOLN
INTRAVENOUS | Status: AC | PRN
  Administered 2017-11-09: 20 mg via INTRAVENOUS

## 2017-11-09 MED ORDER — SODIUM CHLORIDE 0.9% FLUSH
3.0000 mL | Freq: Two times a day (BID) | INTRAVENOUS | Status: DC
  Administered 2017-11-09 (×2): 3 mL via INTRAVENOUS

## 2017-11-09 MED ORDER — ANGIOPLASTY BOOK
Freq: Once | Status: AC
  Administered 2017-11-09: 22:00:00
  Filled 2017-11-09: qty 1

## 2017-11-09 MED ORDER — ASPIRIN 81 MG PO CHEW: 81.0000 mg | CHEWABLE_TABLET | ORAL | Status: DC

## 2017-11-09 MED ORDER — FENTANYL CITRATE (PF) 100 MCG/2ML IJ SOLN
INTRAMUSCULAR | Status: DC | PRN
  Administered 2017-11-09: 25 ug via INTRAVENOUS

## 2017-11-09 MED ORDER — ASPIRIN 81 MG PO CHEW: 81.0000 mg | CHEWABLE_TABLET | Freq: Every day | ORAL | Status: DC

## 2017-11-09 MED ORDER — LABETALOL HCL 5 MG/ML IV SOLN: 10.0000 mg | INTRAVENOUS | Status: AC | PRN

## 2017-11-09 MED ORDER — BIVALIRUDIN BOLUS VIA INFUSION - CUPID
INTRAVENOUS | Status: DC | PRN
  Administered 2017-11-09: 71.475 mg via INTRAVENOUS

## 2017-11-09 SURGICAL SUPPLY — 20 items
BALLN SAPPHIRE 2.0X12 (BALLOONS) ×2
BALLN SAPPHIRE ~~LOC~~ 3.75X12 (BALLOONS) ×1 IMPLANT
BALLOON SAPPHIRE 2.0X12 (BALLOONS) IMPLANT
CATH INFINITI 5FR ANG PIGTAIL (CATHETERS) ×1 IMPLANT
CATH OPTITORQUE TIG 4.0 5F (CATHETERS) ×1 IMPLANT
CATH VISTA GUIDE 6FR XB3.5 (CATHETERS) ×1 IMPLANT
DEVICE RAD COMP TR BAND LRG (VASCULAR PRODUCTS) ×1 IMPLANT
GLIDESHEATH SLEND A-KIT 6F 22G (SHEATH) ×1 IMPLANT
GUIDEWIRE INQWIRE 1.5J.035X260 (WIRE) IMPLANT
INQWIRE 1.5J .035X260CM (WIRE) ×2
KIT ENCORE 26 ADVANTAGE (KITS) ×1 IMPLANT
KIT HEART LEFT (KITS) ×2 IMPLANT
KIT HEMO VALVE WATCHDOG (MISCELLANEOUS) ×1 IMPLANT
PACK CARDIAC CATHETERIZATION (CUSTOM PROCEDURE TRAY) ×2 IMPLANT
STENT SYNERGY DES 3.5X16 (Permanent Stent) ×1 IMPLANT
SYR MEDRAD MARK V 150ML (SYRINGE) ×2 IMPLANT
TRANSDUCER W/STOPCOCK (MISCELLANEOUS) ×2 IMPLANT
TUBING CIL FLEX 10 FLL-RA (TUBING) ×2 IMPLANT
WIRE ASAHI PROWATER 180CM (WIRE) ×1 IMPLANT
WIRE HI TORQ VERSACORE-J 145CM (WIRE) ×1 IMPLANT

## 2017-11-09 NOTE — Interval H&P Note (Signed)
Cath Lab Visit (complete for each Cath Lab visit)  Clinical Evaluation Leading to the Procedure:   ACS: No.  Non-ACS:    Anginal Classification: CCS II  Anti-ischemic medical therapy: No Therapy  Non-Invasive Test Results: No non-invasive testing performed  Prior CABG: No previous CABG      History and Physical Interval Note:  11/09/2017 10:21 AM  Connor Wright  has presented today for surgery, with the diagnosis of cp  The various methods of treatment have been discussed with the patient and family. After consideration of risks, benefits and other options for treatment, the patient has consented to  Procedure(s): LEFT HEART CATH AND CORONARY ANGIOGRAPHY (N/A) as a surgical intervention .  The patient's history has been reviewed, patient examined, no change in status, stable for surgery.  I have reviewed the patient's chart and labs.  Questions were answered to the patient's satisfaction.     Quay Burow

## 2017-11-10 DIAGNOSIS — E663 Overweight: Secondary | ICD-10-CM | POA: Diagnosis not present

## 2017-11-10 DIAGNOSIS — Z8249 Family history of ischemic heart disease and other diseases of the circulatory system: Secondary | ICD-10-CM | POA: Diagnosis not present

## 2017-11-10 DIAGNOSIS — I25110 Atherosclerotic heart disease of native coronary artery with unstable angina pectoris: Secondary | ICD-10-CM

## 2017-11-10 DIAGNOSIS — I2583 Coronary atherosclerosis due to lipid rich plaque: Secondary | ICD-10-CM | POA: Diagnosis not present

## 2017-11-10 DIAGNOSIS — I10 Essential (primary) hypertension: Secondary | ICD-10-CM | POA: Diagnosis not present

## 2017-11-10 DIAGNOSIS — E119 Type 2 diabetes mellitus without complications: Secondary | ICD-10-CM | POA: Diagnosis not present

## 2017-11-10 DIAGNOSIS — E7800 Pure hypercholesterolemia, unspecified: Secondary | ICD-10-CM

## 2017-11-10 DIAGNOSIS — Z6834 Body mass index (BMI) 34.0-34.9, adult: Secondary | ICD-10-CM | POA: Diagnosis not present

## 2017-11-10 DIAGNOSIS — Z7984 Long term (current) use of oral hypoglycemic drugs: Secondary | ICD-10-CM | POA: Diagnosis not present

## 2017-11-10 DIAGNOSIS — E785 Hyperlipidemia, unspecified: Secondary | ICD-10-CM | POA: Diagnosis not present

## 2017-11-10 DIAGNOSIS — Z7982 Long term (current) use of aspirin: Secondary | ICD-10-CM | POA: Diagnosis not present

## 2017-11-10 DIAGNOSIS — Z87891 Personal history of nicotine dependence: Secondary | ICD-10-CM | POA: Diagnosis not present

## 2017-11-10 DIAGNOSIS — I25118 Atherosclerotic heart disease of native coronary artery with other forms of angina pectoris: Secondary | ICD-10-CM | POA: Diagnosis not present

## 2017-11-10 LAB — BASIC METABOLIC PANEL
Anion gap: 7 (ref 5–15)
BUN: 11 mg/dL (ref 6–20)
CHLORIDE: 106 mmol/L (ref 101–111)
CO2: 25 mmol/L (ref 22–32)
Calcium: 8.8 mg/dL — ABNORMAL LOW (ref 8.9–10.3)
Creatinine, Ser: 0.79 mg/dL (ref 0.61–1.24)
GFR calc Af Amer: >60 mL/min (ref >60)
GFR calc non Af Amer: >60 mL/min (ref >60)
GLUCOSE: 123 mg/dL — AB (ref 65–99)
POTASSIUM: 3.8 mmol/L (ref 3.5–5.1)
Sodium: 138 mmol/L (ref 135–145)

## 2017-11-10 LAB — GLUCOSE, CAPILLARY: GLUCOSE-CAPILLARY: 123 mg/dL — AB (ref 65–99)

## 2017-11-10 LAB — CBC
HEMATOCRIT: 40.1 % (ref 39.0–52.0)
Hemoglobin: 12.9 g/dL — ABNORMAL LOW (ref 13.0–17.0)
MCH: 29.1 pg (ref 26.0–34.0)
MCHC: 32.2 g/dL (ref 30.0–36.0)
MCV: 90.3 fL (ref 78.0–100.0)
Platelets: 269 10*3/uL (ref 150–400)
RBC: 4.44 MIL/uL (ref 4.22–5.81)
RDW: 12.6 % (ref 11.5–15.5)
WBC: 10.4 10*3/uL (ref 4.0–10.5)

## 2017-11-10 LAB — HEPATIC FUNCTION PANEL
ALBUMIN: 3.7 g/dL (ref 3.5–5.0)
ALT: 28 U/L (ref 17–63)
AST: 24 U/L (ref 15–41)
Alkaline Phosphatase: 60 U/L (ref 38–126)
BILIRUBIN TOTAL: 0.7 mg/dL (ref 0.3–1.2)
Bilirubin, Direct: <0.1 mg/dL — ABNORMAL LOW (ref 0.1–0.5)
Total Protein: 6.4 g/dL — ABNORMAL LOW (ref 6.5–8.1)

## 2017-11-10 LAB — LIPID PANEL
CHOLESTEROL: 138 mg/dL (ref 0–200)
HDL: 31 mg/dL — ABNORMAL LOW (ref >40)
LDL Cholesterol: 58 mg/dL (ref 0–99)
Total CHOL/HDL Ratio: 4.5 RATIO
Triglycerides: 245 mg/dL — ABNORMAL HIGH (ref <150)
VLDL: 49 mg/dL — AB (ref 0–40)

## 2017-11-10 MED ORDER — PANTOPRAZOLE SODIUM 40 MG PO TBEC: 40.0000 mg | DELAYED_RELEASE_TABLET | Freq: Every day | ORAL | 3 refills | Status: DC

## 2017-11-10 MED ORDER — METFORMIN HCL 500 MG PO TABS: 500.0000 mg | ORAL_TABLET | Freq: Two times a day (BID) | ORAL | Status: DC

## 2017-11-10 MED ORDER — CLOPIDOGREL BISULFATE 75 MG PO TABS: 75.0000 mg | ORAL_TABLET | Freq: Every day | ORAL | 3 refills | Status: DC

## 2017-11-10 MED FILL — Heparin Sod (Porcine)-NaCl IV Soln 1000 Unit/500ML-0.9%: INTRAVENOUS | Qty: 1000 | Status: AC

## 2017-11-10 MED FILL — Nitroglycerin IV Soln 100 MCG/ML in D5W: INTRA_ARTERIAL | Qty: 10 | Status: AC

## 2017-11-10 NOTE — Discharge Summary (Addendum)
The patient has been seen in conjunction with Fabian Sharp, PA-C. All aspects of care have been considered and discussed. The patient has been personally interviewed, examined, and all clinical data has been reviewed.   PCI result.  No access site bleeding.  Switch omeprazole to Protonix to avoid interactions with Plavix.  Discharge today.  Phase 2 cardiac rehab recommended.  Discharge Summary    Patient ID: Connor Wright,  MRN: [id number], DOB/AGE: 67-Jan-1952 67 y.o.  Admit date: Dec 06, 2017 Discharge date: 11/10/2017  Primary Care Provider: Levin Erp Primary Cardiologist: Quay Burow, MD  Discharge Diagnoses    Principal Problem:   CAD (coronary artery disease) Active Problems:   Essential hypertension   Hyperlipidemia   Family history of heart disease   Chest pain   Allergies No Known Allergies  Diagnostic Studies/Procedures    Heart cath 12/06/2017:  Prox Cx to Mid Cx lesion is 95% stenosed.  A stent was successfully placed.  Post intervention, there is a 0% residual stenosis.  The left ventricular systolic function is normal.  LV end diastolic pressure is normal.  The left ventricular ejection fraction is 55-65% by visual estimate.  IMPRESSION:Successful PCI and stenting of mid circumflex high-grade stenosis with synergy drug-eluting stent with postdilatation diameter of 3.9 mm. Angiomax will continue for full 4 hours of full dose. The patient will be discharged home in the morning on aspirin and Plavix. He will remain on dual anti-therapy uninterrupted for 12 months. I will see him back in the office 2 to 3 weeks after discharge.   History of Present Illness     Connor Wright is a 67 y.o. 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  He was referred by Dr. Nyoka Cowden for cardiovascular evaluation because of new onset dyspnea and chest pressure.  Risk factors include 40 pack years of  tobacco abuse having quit 12 years ago, treated diabetes and hyperlipidemia as well as family history of heart disease with 2 brothers who had stents and bypass surgery.  He has never had a heart attack or stroke.  He has noticed increasing dyspnea on exertion and chest  pressure with walking up a hill in the last several weeks which sounds fairly typical for angina.  Hospital Course     Consultants: none  Chest pain Pt was scheduled for outpatient heart catheterization. Heart cath revealed 95% occlusion of the prox-mid Cx treated with DES. Left ventricular function appeared normal with normal end diastolic pressures. Pt tolerated the procedure well. Right radial access site C/D/I without hematoma. He discharged on ASA and plavix x 12 months.   HLD Lipid profile and LFTs pending, none listed in Epic. Started on 80 mg lipitor here. Will repeat fasting lipids and LFTs in 6 months.   DM2 Hold metformin for 2 days. Per PCP.  Smoker Encouraged cessation.  _____________  Discharge Vitals Blood pressure (!) 151/75, pulse 92, temperature 98.4 F (36.9 C), temperature source Oral, resp. rate (!) 21, height 5\' 7"  (1.702 m), weight 217 lb 6 oz (98.6 kg), SpO2 98 %.  Filed Weights   December 06, 2017 0934 11/10/17 0334  Weight: 210 lb (95.3 kg) 217 lb 6 oz (98.6 kg)    Labs & Radiologic Studies    CBC Recent Labs    11/10/17 0254  WBC 10.4  HGB 12.9*  HCT 40.1  MCV 90.3  PLT 595   Basic Metabolic Panel Recent Labs    11/10/17 0254  NA 138  K 3.8  CL 106  CO2 25  GLUCOSE 123*  BUN 11  CREATININE 0.79  CALCIUM 8.8*   Liver Function Tests Recent Labs    11/10/17 0813  AST 24  ALT 28  ALKPHOS 60  BILITOT 0.7  PROT 6.4*  ALBUMIN 3.7   No results for input(s): LIPASE, AMYLASE in the last 72 hours. Cardiac Enzymes No results for input(s): CKTOTAL, CKMB, CKMBINDEX, TROPONINI in the last 72 hours. BNP Invalid input(s): POCBNP D-Dimer No results for input(s): DDIMER in the last  72 hours. Hemoglobin A1C No results for input(s): HGBA1C in the last 72 hours. Fasting Lipid Panel Recent Labs    11/10/17 0720  CHOL 138  HDL 31*  LDLCALC 58  TRIG 245*  CHOLHDL 4.5   Thyroid Function Tests No results for input(s): TSH, T4TOTAL, T3FREE, THYROIDAB in the last 72 hours.  Invalid input(s): FREET3 _____________  No results found. Disposition   Pt is being discharged home today in good condition.  Follow-up Plans & Appointments    Follow-up Information    Erlene Quan, PA-C Follow up on 11/20/2017.   Specialties:  Cardiology, Radiology Why:  8:30 am for TCM Contact information: [address] [place] [address] [phone]          Discharge Instructions    AMB Referral to Cardiac Rehabilitation - Phase II   Complete by:  As directed    Diagnosis:  Coronary Stents   Amb Referral to Cardiac Rehabilitation   Complete by:  As directed    Diagnosis:  Coronary Stents   Diet - low sodium heart healthy   Complete by:  As directed    Discharge instructions   Complete by:  As directed    No driving for 1 week. No lifting over 5 lbs for 1 week. No sexual activity for 1 week. You may return to work in 1 week without restrictions. Keep procedure site clean & dry. If you notice increased pain, swelling, bleeding or pus, call/return!  You may shower, but no soaking baths/hot tubs/pools for 1 week.   Increase activity slowly   Complete by:  As directed       Discharge Medications   Allergies as of 11/10/2017   No Known Allergies     Medication List    STOP taking these medications   meloxicam 7.5 MG tablet Commonly known as:  MOBIC   omeprazole 20 MG capsule Commonly known as:  PRILOSEC Replaced by:  pantoprazole 40 MG tablet     TAKE these medications   ALPRAZolam 0.25 MG tablet Commonly known as:  XANAX Take 0.25 mg by mouth 3 (three) times daily. Notes to patient:  Anxiety    aspirin 81 MG tablet Take 81 mg by mouth  daily. Notes to patient:  Prevents clotting in stent and heart attack   atorvastatin 80 MG tablet Commonly known as:  LIPITOR Take 80 mg by mouth daily. Notes to patient:  Lowers cholesterol    clopidogrel 75 MG tablet Commonly known as:  PLAVIX Take 1 tablet (75 mg total) by mouth daily with breakfast. Notes to patient:  Prevents clotting in stent and heart attack    cyclobenzaprine 5 MG tablet Commonly known as:  FLEXERIL Take 1 tablet (5 mg total) by mouth at bedtime as needed for muscle spasms. Notes to patient:  As needed for muscle spasms at night    DRY EYES OP Place 1 drop into both eyes daily as needed (for dry  eyes). Notes to patient:  As needed for dry eyes    LUTEIN PO Take 1 capsule by mouth daily.   metFORMIN 500 MG tablet Commonly known as:  GLUCOPHAGE Take 1 tablet (500 mg total) by mouth 2 (two) times daily with a meal. Resume in 48 hrs. What changed:  additional instructions   metoprolol succinate 50 MG 24 hr tablet Commonly known as:  TOPROL-XL Take 50 mg by mouth daily. Notes to patient:  Decreases the work of the heart  Lowers blood pressure and heart rate    nitroGLYCERIN 0.4 MG SL tablet Commonly known as:  NITROSTAT Place 0.4 mg under the tongue every 5 (five) minutes as needed for chest pain. Notes to patient:  As needed for chest pain    pantoprazole 40 MG tablet Commonly known as:  PROTONIX Take 1 tablet (40 mg total) by mouth daily. Start taking on:  11/11/2017 Replaces:  omeprazole 20 MG capsule   tamsulosin 0.4 MG Caps capsule Commonly known as:  FLOMAX Take 0.4 mg by mouth daily. Notes to patient:  Treat enlarged prostate         Acute coronary syndrome (MI, NSTEMI, STEMI, etc) this admission?: No.    Outstanding Labs/Studies   Fasting lipids and LFTs in 1 week  Duration of Discharge Encounter   Greater than 30 minutes including physician time.  Signed, [place], [place] 11/10/2017, 12:34  PM

## 2017-11-10 NOTE — Progress Notes (Signed)
Progress Note  Patient Name: Connor Wright Date of Encounter: 11/10/2017  Primary Cardiologist: Quay Burow, MD   Subjective   Pt denies chest pain, SOB, palpitations, and bleeding issues. Pt ready for discharge.  Inpatient Medications    Scheduled Meds: . ALPRAZolam  0.25 mg Oral TID  . aspirin  81 mg Oral Daily  . atorvastatin  80 mg Oral q1800  . clopidogrel  75 mg Oral Q breakfast  . metoprolol succinate  50 mg Oral Daily  . pantoprazole  40 mg Oral Daily  . sodium chloride flush  3 mL Intravenous Q12H  . tamsulosin  0.4 mg Oral QPM   Continuous Infusions: . sodium chloride     PRN Meds: sodium chloride, acetaminophen, morphine injection, nitroGLYCERIN, ondansetron (ZOFRAN) IV, sodium chloride flush   Vital Signs    Vitals:   11/09/17 1901 11/09/17 2000 11/10/17 0334 11/10/17 0743  BP: (!) 161/86  (!) 142/71 (!) 166/80  Pulse: 90  87 92  Resp: 20 19 (!) 28 (!) 22  Temp: 98.7 F (37.1 C)  97.9 F (36.6 C) 98.4 F (36.9 C)  TempSrc: Oral  Oral Oral  SpO2: 98%  99% 98%  Weight:   217 lb 6 oz (98.6 kg)   Height:        Intake/Output Summary (Last 24 hours) at 11/10/2017 0804 Last data filed at 11/10/2017 0744 Gross per 24 hour  Intake 2978.2 ml  Output 2475 ml  Net 503.2 ml   Filed Weights   11/09/17 0934 11/10/17 0334  Weight: 210 lb (95.3 kg) 217 lb 6 oz (98.6 kg)    Telemetry    Sinus  - Personally Reviewed  ECG    Sinus  - Personally Reviewed  Physical Exam   GEN: No acute distress.   Neck: No JVD Cardiac: RRR, no murmurs, rubs, or gallops. Right radial site C/D/I, no hematoma Respiratory: Clear to auscultation bilaterally. GI: Soft, nontender, non-distended  MS: No edema; No deformity. Neuro:  Nonfocal  Psych: Normal affect   Labs    Chemistry Recent Labs  Lab 11/10/17 0254  NA 138  K 3.8  CL 106  CO2 25  GLUCOSE 123*  BUN 11  CREATININE 0.79  CALCIUM 8.8*  GFRNONAA >60  GFRAA >60  ANIONGAP 7      Hematology Recent Labs  Lab 11/10/17 0254  WBC 10.4  RBC 4.44  HGB 12.9*  HCT 40.1  MCV 90.3  MCH 29.1  MCHC 32.2  RDW 12.6  PLT 269    Cardiac EnzymesNo results for input(s): TROPONINI in the last 168 hours. No results for input(s): TROPIPOC in the last 168 hours.   BNPNo results for input(s): BNP, PROBNP in the last 168 hours.   DDimer No results for input(s): DDIMER in the last 168 hours.   Radiology    No results found.  Cardiac Studies   Heart cath 11/09/17:  Prox Cx to Mid Cx lesion is 95% stenosed.  A stent was successfully placed.  Post intervention, there is a 0% residual stenosis.  The left ventricular systolic function is normal.  LV end diastolic pressure is normal.  The left ventricular ejection fraction is 55-65% by visual estimate.  IMPRESSION: Successful PCI and stenting of mid circumflex high-grade stenosis with synergy drug-eluting stent with postdilatation diameter of 3.9 mm.  Angiomax will continue for full 4 hours of full dose.  The patient will be discharged home in the morning on aspirin and Plavix.  He will remain  on dual anti-therapy uninterrupted for 12 months.  I will see him back in the office 2 to 3 weeks after discharge.  Patient Profile     67 y.o. male with a past medical history significant for smoking, HLD, DM2, GERD, and anxiety. He presented to clinic for chest pressure and DOE and underwent heart cath 11/09/17.  Assessment & Plan    1. Chest pressure, DOE - left heart cath with 95% proximal to mid Cx treated with DES; normal LV and end diastolic pressure - pt denies chest pain this morning - right radial site C/D/I - no bleeding problems   2. HLD - lipid profile pending  - on 80 mg lipitor   3. DM - restart metformin in 2 days - per PCP   For questions or updates, please contact [place] Please consult [email or web address] for contact info under Cardiology/STEMI.      Signed, [place], PA   11/10/2017, 8:04 AM

## 2017-11-10 NOTE — Progress Notes (Signed)
CARDIAC REHAB PHASE I   PRE:  Rate/Rhythm: 97 SR  BP:  Supine: 143/54  Sitting:   Standing:    SaO2: 93%RA  MODE:  Ambulation: 600 ft   POST:  Rate/Rhythm: 108 ST  BP:  Supine:   Sitting: 160/82  Standing:    SaO2: 96%RA [id number] Pt walked 600 ft with steady gait and no CP. Tolerated well. Discussed the importance of plavix with stent. Reviewed NTG use, ex ed, gave diabetic and heart healthy diets, and discussed CRP 2. Referred to [facility] program.   Graylon Good, RN BSN  11/10/2017 9:16 AM

## 2017-11-16 ENCOUNTER — Telehealth (HOSPITAL_COMMUNITY): Payer: Self-pay

## 2017-11-16 NOTE — Telephone Encounter (Signed)
Patients insurance is active and benefits verified through [employer] - $10.00 co-pay, no deductible, out of pocket amount of $3,100/$0.00 has been met, no co-insurance, and no pre-authorization is required. Passport/reference [phone]  Will contact patient to see if he is interested in the Cardiac Rehab Program. If interested, patient will be contacted for scheduling upon review by the RN Navigator.

## 2017-11-20 ENCOUNTER — Ambulatory Visit (INDEPENDENT_AMBULATORY_CARE_PROVIDER_SITE_OTHER): Payer: PPO | Admitting: Cardiology

## 2017-11-20 ENCOUNTER — Encounter: Payer: Self-pay | Admitting: Cardiology

## 2017-11-20 VITALS — BP 134/84 | HR 88 | Ht 67.0 in | Wt 216.8 lb

## 2017-11-20 DIAGNOSIS — F17200 Nicotine dependence, unspecified, uncomplicated: Secondary | ICD-10-CM | POA: Diagnosis not present

## 2017-11-20 DIAGNOSIS — E119 Type 2 diabetes mellitus without complications: Secondary | ICD-10-CM | POA: Diagnosis not present

## 2017-11-20 DIAGNOSIS — Z8249 Family history of ischemic heart disease and other diseases of the circulatory system: Secondary | ICD-10-CM | POA: Diagnosis not present

## 2017-11-20 DIAGNOSIS — I2510 Atherosclerotic heart disease of native coronary artery without angina pectoris: Secondary | ICD-10-CM | POA: Diagnosis not present

## 2017-11-20 DIAGNOSIS — I208 Other forms of angina pectoris: Secondary | ICD-10-CM

## 2017-11-20 DIAGNOSIS — Z9861 Coronary angioplasty status: Secondary | ICD-10-CM | POA: Diagnosis not present

## 2017-11-20 DIAGNOSIS — E785 Hyperlipidemia, unspecified: Secondary | ICD-10-CM | POA: Diagnosis not present

## 2017-11-20 DIAGNOSIS — I10 Essential (primary) hypertension: Secondary | ICD-10-CM | POA: Diagnosis not present

## 2017-11-20 NOTE — Patient Instructions (Signed)
Medication Instructions: Your physician recommends that you continue on your current medications as directed. Please refer to the Current Medication list given to you today.  If you need a refill on your cardiac medications before your next appointment, please call your pharmacy.   Follow-Up: Your physician wants you to follow-up in 3 months with Dr. Gwenlyn Found.    Special Instructions:  A referral has been placed to cardiac rehabilitation.    Thank you for choosing [facility] at [facility]!!

## 2017-11-20 NOTE — Assessment & Plan Note (Signed)
He tells me he is not smoking now

## 2017-11-20 NOTE — Progress Notes (Signed)
11/20/2017 Connor Wright   11/29/1950  [id number]  Primary Physician Connor Erp, MD Primary Cardiologist: Dr Connor Wright  HPI:  67 y/o male referred to Dr Connor Wright 11/01/17 for exertional chest pain and dyspnea. The pt has multiple cardiac risk factors and his symptoms were typical for exertional angina. OP cath was arranged and this revealed mCFX disease which was stented. He had no other CAD and had normal LVF. He is in the office today for follow up. He denies any chest pain or dyspnea. He does say he he feel generally fatigued.He denies any issues with his medications.    Current Outpatient Medications  Medication Sig Dispense Refill  . ALPRAZolam (XANAX) 0.25 MG tablet Take 0.25 mg by mouth 3 (three) times daily.     . Artificial Tear Ointment (DRY EYES OP) Place 1 drop into both eyes daily as needed (for dry eyes).    Marland Kitchen aspirin 81 MG tablet Take 81 mg by mouth daily.    Marland Kitchen atorvastatin (LIPITOR) 80 MG tablet Take 80 mg by mouth daily.    . clopidogrel (PLAVIX) 75 MG tablet Take 1 tablet (75 mg total) by mouth daily with breakfast. 90 tablet 3  . cyclobenzaprine (FLEXERIL) 5 MG tablet Take 1 tablet (5 mg total) by mouth at bedtime as needed for muscle spasms. 10 tablet 0  . LUTEIN PO Take 1 capsule by mouth daily.    . metFORMIN (GLUCOPHAGE) 500 MG tablet Take 1 tablet (500 mg total) by mouth 2 (two) times daily with a meal. Resume in 48 hrs.    . metoprolol succinate (TOPROL-XL) 50 MG 24 hr tablet Take 50 mg by mouth daily.  5  . nitroGLYCERIN (NITROSTAT) 0.4 MG SL tablet Place 0.4 mg under the tongue every 5 (five) minutes as needed for chest pain.   5  . pantoprazole (PROTONIX) 40 MG tablet Take 1 tablet (40 mg total) by mouth daily. 90 tablet 3  . tamsulosin (FLOMAX) 0.4 MG CAPS capsule Take 0.4 mg by mouth daily.     No current facility-administered medications for this visit.     No Known Allergies  Past Medical History:  Diagnosis Date  . Anxiety   . Coronary artery  disease   . GERD (gastroesophageal reflux disease)   . High cholesterol   . Type II diabetes mellitus ([place])     Social History   Socioeconomic History  . Marital status: Married    Spouse name: Not on file  . Number of children: Not on file  . Years of education: Not on file  . Highest education level: Not on file  Occupational History  . Not on file  Social Needs  . Financial resource strain: Not on file  . Food insecurity:    Worry: Not on file    Inability: Not on file  . Transportation needs:    Medical: Not on file    Non-medical: Not on file  Tobacco Use  . Smoking status: Former Smoker    Packs/day: 1.00    Years: 35.00    Pack years: 35.00    Types: Cigarettes    Last attempt to quit: 2007    Years since quitting: 12.4  . Smokeless tobacco: Never Used  Substance and Sexual Activity  . Alcohol use: Not Currently    Comment: 11/09/2017 "nothing in the last 35 years"  . Drug use: Never  . Sexual activity: Yes  Lifestyle  . Physical activity:    Days per week: Not  on file    Minutes per session: Not on file  . Stress: Not on file  Relationships  . Social connections:    Talks on phone: Not on file    Gets together: Not on file    Attends religious service: Not on file    Active member of club or organization: Not on file    Attends meetings of clubs or organizations: Not on file    Relationship status: Not on file  . Intimate partner violence:    Fear of current or ex partner: Not on file    Emotionally abused: Not on file    Physically abused: Not on file    Forced sexual activity: Not on file  Other Topics Concern  . Not on file  Social History Narrative  . Not on file     Family History  Problem Relation Age of Onset  . Diabetes Sister      Review of Systems: General: negative for chills, fever, night sweats or weight changes.  Cardiovascular: negative for chest pain, dyspnea on exertion, edema, orthopnea, palpitations, paroxysmal nocturnal  dyspnea or shortness of breath Dermatological: negative for rash Respiratory: negative for cough or wheezing Urologic: negative for hematuria Abdominal: negative for nausea, vomiting, diarrhea, bright red blood per rectum, melena, or hematemesis Neurologic: negative for visual changes, syncope, or dizziness All other systems reviewed and are otherwise negative except as noted above.    Blood pressure 134/84, pulse 88, height 5\' 7"  (1.702 m), weight 216 lb 12.8 oz (98.3 kg).  General appearance: alert, cooperative, no distress and moderately obese Neck: no carotid bruit and no JVD Lungs: clear to auscultation bilaterally Heart: regular rate and rhythm Extremities: extremities normal, atraumatic, no cyanosis or edema Skin: Skin color, texture, turgor normal. No rashes or lesions Neurologic: Grossly normal  EKG NSR  ASSESSMENT AND PLAN:   Exertional angina (HCC) Evaluated by Dr Connor Wright 11/01/17 (OP cath arranged)  CAD S/P percutaneous coronary angioplasty CFX PCI with DES 11/09/17 No other CAD, normal LVF  Essential hypertension Fair control  Dyslipidemia LDL 59, HDL 31, trig 245- he was on high dose prior to cath- No change  Diabetes mellitus type 2, noninsulin dependent ([place]) On Metformin  Smoker He tells me he is not smoking now   PLAN  I did not change Connor Wright's medications. I did suggest he consider cardiac rehab and we'll arrange for them to contact him. I asked him about sleep apnea symptoms and he denied any. F/U with Dr Connor Wright in 3 months.   Kerin Ransom PA-C 11/20/2017 9:08 AM

## 2017-11-20 NOTE — Assessment & Plan Note (Signed)
Evaluated by Dr Gwenlyn Found 11/01/17 (OP cath arranged)

## 2017-11-20 NOTE — Assessment & Plan Note (Signed)
LDL 59, HDL 31, trig 245- he was on high dose prior to cath- No change

## 2017-11-20 NOTE — Assessment & Plan Note (Signed)
CFX PCI with DES 11/09/17 No other CAD, normal LVF

## 2017-11-20 NOTE — Assessment & Plan Note (Signed)
On Metformin 

## 2017-11-20 NOTE — Assessment & Plan Note (Signed)
Fair control.

## 2017-11-23 ENCOUNTER — Telehealth (HOSPITAL_COMMUNITY): Payer: Self-pay

## 2017-11-23 NOTE — Telephone Encounter (Signed)
Called and spoke with patient to see if he is interested in the Cardiac Rehab Program - Patient stated he is interested. He explained to me what his work schedule is like. Patient will ONLY be able to attend the 2:45pm exc class. Explained scheduling process and patient verbalized understanding. Will contact patient for scheduling upon review by the RN Navigator.

## 2017-11-29 ENCOUNTER — Telehealth (HOSPITAL_COMMUNITY): Payer: Self-pay

## 2017-11-29 NOTE — Telephone Encounter (Signed)
Called patient to see if he was interested in participating in the Cardiac Rehab Program. Patient stated yes. Patient will come in for orientation on 01/09/18 @ 1:30PM and will attend the 2:45PM (ONLY M & W) exercise class.  Mailed homework package.

## 2017-12-18 ENCOUNTER — Encounter: Payer: Self-pay | Admitting: Cardiovascular Disease

## 2017-12-19 ENCOUNTER — Telehealth: Payer: Self-pay

## 2017-12-19 NOTE — Telephone Encounter (Signed)
Called the patient re: his my chart message. He was reporting having some "chest tightness" in his sternal area 2-3 times a day. He says it is not related to exertion and only lasts a few minutes. He has not felt the need to use his nitro because it is relieved easily with rest. He denies sob, dizziness, but is very tired and unable to do some of his normal activities. Advised pt that he should be seen. Appt made with Kerin Ransom PA this week on Thursday at 2pm. Pt agrees and will call or go to the ER if anything worsens. Instructed pt on how to take his nitro of he needs it.

## 2017-12-21 ENCOUNTER — Encounter: Payer: Self-pay | Admitting: Cardiology

## 2017-12-21 ENCOUNTER — Ambulatory Visit: Payer: PPO | Admitting: Cardiology

## 2017-12-21 VITALS — BP 132/78 | HR 85 | Ht 66.0 in | Wt 218.0 lb

## 2017-12-21 DIAGNOSIS — Z8249 Family history of ischemic heart disease and other diseases of the circulatory system: Secondary | ICD-10-CM | POA: Diagnosis not present

## 2017-12-21 DIAGNOSIS — Z9861 Coronary angioplasty status: Secondary | ICD-10-CM

## 2017-12-21 DIAGNOSIS — E785 Hyperlipidemia, unspecified: Secondary | ICD-10-CM

## 2017-12-21 DIAGNOSIS — I2510 Atherosclerotic heart disease of native coronary artery without angina pectoris: Secondary | ICD-10-CM | POA: Diagnosis not present

## 2017-12-21 DIAGNOSIS — I10 Essential (primary) hypertension: Secondary | ICD-10-CM | POA: Diagnosis not present

## 2017-12-21 DIAGNOSIS — R079 Chest pain, unspecified: Secondary | ICD-10-CM

## 2017-12-21 DIAGNOSIS — E119 Type 2 diabetes mellitus without complications: Secondary | ICD-10-CM | POA: Diagnosis not present

## 2017-12-21 DIAGNOSIS — F17200 Nicotine dependence, unspecified, uncomplicated: Secondary | ICD-10-CM | POA: Diagnosis not present

## 2017-12-21 NOTE — Progress Notes (Signed)
12/21/2017 Connor Wright   [birth year]  [id number]  Primary Physician Levin Erp, MD Primary Cardiologist: Dr Gwenlyn Found  HPI:  67 y/o obese male referred to Dr Gwenlyn Found 11/01/17 for exertional chest pain and dyspnea. The pt has multiple cardiac risk factors and his symptoms were typical for exertional angina. OP cath was arranged and this revealed mCFX disease of 95% which was stented. He had no other CAD and had normal LVF. He was seen in follow up 11/20/17 and reported he was doing OK. He has been going to Cardiac Rehab. He says he gets SOB with exercise. He has noticed localized mid epigastric discomfort, actually since his PCI. His symptoms are not related to exertion. He has not taken anything for it as it usually spontaneously resolves in 1-2 minutes. He says it feels similar but less sever than his pre PCI symptoms.    Current Outpatient Medications  Medication Sig Dispense Refill  . ALPRAZolam (XANAX) 0.25 MG tablet Take 0.25 mg by mouth 3 (three) times daily.     . Artificial Tear Ointment (DRY EYES OP) Place 1 drop into both eyes daily as needed (for dry eyes).    Marland Kitchen aspirin 81 MG tablet Take 81 mg by mouth daily.    Marland Kitchen atorvastatin (LIPITOR) 80 MG tablet Take 80 mg by mouth daily.    . clopidogrel (PLAVIX) 75 MG tablet Take 1 tablet (75 mg total) by mouth daily with breakfast. 90 tablet 3  . cyclobenzaprine (FLEXERIL) 5 MG tablet Take 1 tablet (5 mg total) by mouth at bedtime as needed for muscle spasms. 10 tablet 0  . LUTEIN PO Take 1 capsule by mouth daily.    . metFORMIN (GLUCOPHAGE) 500 MG tablet Take 1 tablet (500 mg total) by mouth 2 (two) times daily with a meal. Resume in 48 hrs.    . metoprolol succinate (TOPROL-XL) 50 MG 24 hr tablet Take 50 mg by mouth daily.  5  . nitroGLYCERIN (NITROSTAT) 0.4 MG SL tablet Place 0.4 mg under the tongue every 5 (five) minutes as needed for chest pain.   5  . pantoprazole (PROTONIX) 40 MG tablet Take 1 tablet (40 mg total) by mouth daily.  90 tablet 3  . tamsulosin (FLOMAX) 0.4 MG CAPS capsule Take 0.4 mg by mouth daily.     No current facility-administered medications for this visit.     No Known Allergies  Past Medical History:  Diagnosis Date  . Anxiety   . Coronary artery disease   . GERD (gastroesophageal reflux disease)   . High cholesterol   . Type II diabetes mellitus ([place])     Social History   Socioeconomic History  . Marital status: Married    Spouse name: Not on file  . Number of children: Not on file  . Years of education: Not on file  . Highest education level: Not on file  Occupational History  . Not on file  Social Needs  . Financial resource strain: Not on file  . Food insecurity:    Worry: Not on file    Inability: Not on file  . Transportation needs:    Medical: Not on file    Non-medical: Not on file  Tobacco Use  . Smoking status: Former Smoker    Packs/day: 1.00    Years: 35.00    Pack years: 35.00    Types: Cigarettes    Last attempt to quit: 2007    Years since quitting: 12.5  . Smokeless tobacco: Never  Used  Substance and Sexual Activity  . Alcohol use: Not Currently    Comment: 11/09/2017 "nothing in the last 35 years"  . Drug use: Never  . Sexual activity: Yes  Lifestyle  . Physical activity:    Days per week: Not on file    Minutes per session: Not on file  . Stress: Not on file  Relationships  . Social connections:    Talks on phone: Not on file    Gets together: Not on file    Attends religious service: Not on file    Active member of club or organization: Not on file    Attends meetings of clubs or organizations: Not on file    Relationship status: Not on file  . Intimate partner violence:    Fear of current or ex partner: Not on file    Emotionally abused: Not on file    Physically abused: Not on file    Forced sexual activity: Not on file  Other Topics Concern  . Not on file  Social History Narrative  . Not on file     Family History  Problem  Relation Age of Onset  . Diabetes Sister      Review of Systems: General: negative for chills, fever, night sweats or weight changes.  Cardiovascular: negative for chest pain, dyspnea on exertion, edema, orthopnea, palpitations, paroxysmal nocturnal dyspnea or shortness of breath Dermatological: negative for rash Respiratory: negative for cough or wheezing Urologic: negative for hematuria Abdominal: negative for nausea, vomiting, diarrhea, bright red blood per rectum, melena, or hematemesis Neurologic: negative for visual changes, syncope, or dizziness All other systems reviewed and are otherwise negative except as noted above.    Blood pressure 132/78, pulse 85, height 5\' 6"  (1.676 m), weight 218 lb (98.9 kg).  General appearance: alert, cooperative, no distress and morbidly obese Neck: no carotid bruit and no JVD Lungs: clear to auscultation bilaterally Heart: regular rate and rhythm Abdomen: truncal obesity Extremities: extremities normal, atraumatic, no cyanosis or edema Skin: Skin color, texture, turgor normal. No rashes or lesions Neurologic: Grossly normal  EKG NSR  ASSESSMENT AND PLAN:   Chest pain Some atypical features but he says it has some similarity to his pre PCI symptoms.   CAD S/P percutaneous coronary angioplasty CFX PCI with DES 11/09/17 No other CAD, normal LVF  Dyslipidemia LDL 59, HDL 31, trig 245- he was on high dose prior to cath- No change  Diabetes mellitus type 2, noninsulin dependent ([facility]CC) On Metformin  Essential hypertension Controlled  Family history of heart disease 2 brothers with CAD  Smoker Ex smoker   PLAN  Check GXT Myoview- f/u Dr Dellia Beckwith PA-C 12/21/2017 2:29 PM

## 2017-12-21 NOTE — Assessment & Plan Note (Signed)
On Metformin 

## 2017-12-21 NOTE — Assessment & Plan Note (Signed)
Some atypical features but he says it has some similarity to his pre PCI symptoms.

## 2017-12-21 NOTE — Assessment & Plan Note (Signed)
CFX PCI with DES 11/09/17 No other CAD, normal LVF

## 2017-12-21 NOTE — Patient Instructions (Addendum)
Medication Instructions: Your physician recommends that you continue on your current medications as directed. Please refer to the Current Medication list given to you today.   Labwork: None Ordered  Procedures/Testing: Your physician has requested that you have en exercise stress myoview. For further information please visit [email or web address]. Please follow instruction sheet, as given.  Please hold your Metoprolol 24 hours before your test    Follow-Up: Your physician recommends that you schedule a follow-up appointment in: 3 months with Dr. Gwenlyn Found    Any Additional Special Instructions Will Be Listed Below (If Applicable).     If you need a refill on your cardiac medications before your next appointment, please call your pharmacy.

## 2017-12-21 NOTE — Assessment & Plan Note (Signed)
Controlled.  

## 2017-12-21 NOTE — Assessment & Plan Note (Signed)
Ex-smoker  

## 2017-12-21 NOTE — Assessment & Plan Note (Signed)
2 brothers with CAD

## 2017-12-21 NOTE — Assessment & Plan Note (Signed)
LDL 59, HDL 31, trig 245- he was on high dose prior to cath- No change

## 2017-12-22 ENCOUNTER — Telehealth (HOSPITAL_COMMUNITY): Payer: Self-pay

## 2017-12-22 NOTE — Telephone Encounter (Signed)
Encounter complete. 

## 2017-12-27 ENCOUNTER — Ambulatory Visit (HOSPITAL_COMMUNITY)
Admission: RE | Admit: 2017-12-27 | Discharge: 2017-12-27 | Disposition: A | Discharge status: Home / Self Care | Payer: PPO | Source: Ambulatory Visit | Attending: Cardiovascular Disease | Admitting: Cardiovascular Disease

## 2017-12-27 DIAGNOSIS — I2510 Atherosclerotic heart disease of native coronary artery without angina pectoris: Secondary | ICD-10-CM

## 2017-12-27 DIAGNOSIS — R079 Chest pain, unspecified: Secondary | ICD-10-CM | POA: Diagnosis not present

## 2017-12-27 DIAGNOSIS — Z9861 Coronary angioplasty status: Secondary | ICD-10-CM | POA: Insufficient documentation

## 2017-12-27 LAB — MYOCARDIAL PERFUSION IMAGING
Estimated workload: 7.1 METS
Exercise duration (min): 6 min
Exercise duration (sec): 36 s
LV dias vol: 76 mL (ref 62–150)
LV sys vol: 25 mL
MPHR: 153 {beats}/min
Peak HR: 136 {beats}/min
Percent HR: 88 %
RPE: 19
Rest HR: 82 {beats}/min
SDS: 1
SRS: 0
SSS: 1
TID: 1.08

## 2017-12-27 MED ORDER — TECHNETIUM TC 99M TETROFOSMIN IV KIT
10.5000 | PACK | Freq: Once | INTRAVENOUS | Status: AC | PRN
  Administered 2017-12-27: 10.5 via INTRAVENOUS
  Filled 2017-12-27: qty 11

## 2017-12-27 MED ORDER — TECHNETIUM TC 99M TETROFOSMIN IV KIT
31.9000 | PACK | Freq: Once | INTRAVENOUS | Status: AC | PRN
  Administered 2017-12-27: 31.9 via INTRAVENOUS
  Filled 2017-12-27: qty 32

## 2018-01-04 ENCOUNTER — Telehealth (HOSPITAL_COMMUNITY): Payer: Self-pay | Admitting: Pharmacist

## 2018-01-04 NOTE — Telephone Encounter (Signed)
Cardiac Rehab Medication Review by a Pharmacist  Does the patient  feel that his/her medications are working for him/her?  yes  Has the patient been experiencing any side effects to the medications prescribed?  no  Does the patient measure his/her own blood pressure or blood glucose at home?  no   Does the patient have any problems obtaining medications due to transportation or finances?   no  Understanding of regimen: good Understanding of indications: good Potential of compliance: good  Pharmacist comments: Patient seems to understand his medication regimen. He says he is not having any side effects from medications. He also mentioned that he no longer is taking cyclobenzaprine or tamsulosin.   Britt Boozer  PGY1 Pharmacy Resident Pager: [phone] 01/04/2018 2:47 PM

## 2018-01-09 ENCOUNTER — Encounter (HOSPITAL_COMMUNITY)
Admission: RE | Admit: 2018-01-09 | Discharge: 2018-01-09 | Disposition: A | Discharge status: Home / Self Care | Payer: PPO | Source: Ambulatory Visit | Attending: Cardiovascular Disease | Admitting: Cardiovascular Disease

## 2018-01-09 ENCOUNTER — Encounter (HOSPITAL_COMMUNITY): Payer: Self-pay

## 2018-01-09 VITALS — BP 124/82 | HR 85 | Ht 65.0 in | Wt 220.5 lb

## 2018-01-09 DIAGNOSIS — Z7902 Long term (current) use of antithrombotics/antiplatelets: Secondary | ICD-10-CM | POA: Diagnosis not present

## 2018-01-09 DIAGNOSIS — E119 Type 2 diabetes mellitus without complications: Secondary | ICD-10-CM | POA: Insufficient documentation

## 2018-01-09 DIAGNOSIS — Z955 Presence of coronary angioplasty implant and graft: Secondary | ICD-10-CM | POA: Diagnosis not present

## 2018-01-09 DIAGNOSIS — E7800 Pure hypercholesterolemia, unspecified: Secondary | ICD-10-CM | POA: Diagnosis not present

## 2018-01-09 DIAGNOSIS — F419 Anxiety disorder, unspecified: Secondary | ICD-10-CM | POA: Diagnosis not present

## 2018-01-09 DIAGNOSIS — Z87891 Personal history of nicotine dependence: Secondary | ICD-10-CM | POA: Diagnosis not present

## 2018-01-09 DIAGNOSIS — I2510 Atherosclerotic heart disease of native coronary artery without angina pectoris: Secondary | ICD-10-CM | POA: Insufficient documentation

## 2018-01-09 DIAGNOSIS — Z79899 Other long term (current) drug therapy: Secondary | ICD-10-CM | POA: Insufficient documentation

## 2018-01-09 DIAGNOSIS — Z7982 Long term (current) use of aspirin: Secondary | ICD-10-CM | POA: Insufficient documentation

## 2018-01-09 DIAGNOSIS — Z7984 Long term (current) use of oral hypoglycemic drugs: Secondary | ICD-10-CM | POA: Diagnosis not present

## 2018-01-09 DIAGNOSIS — K219 Gastro-esophageal reflux disease without esophagitis: Secondary | ICD-10-CM | POA: Diagnosis not present

## 2018-01-09 NOTE — Progress Notes (Signed)
Cardiac Individual Treatment Plan  Patient Details  Name: Connor Wright MRN: [id number] Date of Birth: Sep 24, 1950 Referring Provider:   Flowsheet Row CARDIAC REHAB PHASE II ORIENTATION from 01/09/2018 in [facility]  Referring Provider  Quay Burow, MD      Initial Encounter Date:  Flowsheet Row CARDIAC REHAB PHASE II ORIENTATION from 01/09/2018 in [facility]  Date  01/09/18      Visit Diagnosis: 11/09/2017 Stented coronary artery-CFX  Patient's Home Medications on Admission:  Current Outpatient Medications:  .  ALPRAZolam (XANAX) 0.25 MG tablet, Take 0.25 mg by mouth 3 (three) times daily. , Disp: , Rfl:  .  Artificial Tear Ointment (DRY EYES OP), Place 1 drop into both eyes daily as needed (for dry eyes)., Disp: , Rfl:  .  aspirin 81 MG tablet, Take 81 mg by mouth daily., Disp: , Rfl:  .  atorvastatin (LIPITOR) 80 MG tablet, Take 80 mg by mouth daily., Disp: , Rfl:  .  clopidogrel (PLAVIX) 75 MG tablet, Take 1 tablet (75 mg total) by mouth daily with breakfast., Disp: 90 tablet, Rfl: 3 .  cyclobenzaprine (FLEXERIL) 5 MG tablet, Take 1 tablet (5 mg total) by mouth at bedtime as needed for muscle spasms. (Patient not taking: Reported on 01/04/2018), Disp: 10 tablet, Rfl: 0 .  LUTEIN PO, Take 1 capsule by mouth daily., Disp: , Rfl:  .  metFORMIN (GLUCOPHAGE) 500 MG tablet, Take 1 tablet (500 mg total) by mouth 2 (two) times daily with a meal. Resume in 48 hrs., Disp: , Rfl:  .  metoprolol succinate (TOPROL-XL) 50 MG 24 hr tablet, Take 50 mg by mouth daily., Disp: , Rfl: 5 .  nitroGLYCERIN (NITROSTAT) 0.4 MG SL tablet, Place 0.4 mg under the tongue every 5 (five) minutes as needed for chest pain. , Disp: , Rfl: 5 .  pantoprazole (PROTONIX) 40 MG tablet, Take 1 tablet (40 mg total) by mouth daily., Disp: 90 tablet, Rfl: 3 .  tamsulosin (FLOMAX) 0.4 MG CAPS capsule, Take 0.4 mg by mouth daily., Disp: , Rfl:   Past  Medical History: Past Medical History:  Diagnosis Date  . Anxiety   . Coronary artery disease   . GERD (gastroesophageal reflux disease)   . High cholesterol   . Type II diabetes mellitus ([facility])     Tobacco Use: Social History   Tobacco Use  Smoking Status Former Smoker  . Packs/day: 1.00  . Years: 35.00  . Pack years: 35.00  . Types: Cigarettes  . Last attempt to quit: 2007  . Years since quitting: 12.6  Smokeless Tobacco Never Used    Labs: Recent Review Scientist, physiological    Labs for ITP Cardiac and Pulmonary Rehab Latest Ref Rng & Units 11/10/2017   Cholestrol 0 - 200 mg/dL 138   LDLCALC 0 - 99 mg/dL 58   HDL >40 mg/dL 31(L)   Trlycerides <150 mg/dL 245(H)      Capillary Blood Glucose: Lab Results  Component Value Date   GLUCAP 123 (H) 11/10/2017   GLUCAP 105 (H) 11/09/2017   GLUCAP 97 11/09/2017   GLUCAP 102 (H) 11/09/2017   GLUCAP 107 (H) 11/09/2017     Exercise Target Goals: Exercise Program Goal: Individual exercise prescription set using results from initial 6 min walk test and THRR while considering  patient's activity barriers and safety.   Exercise Prescription Goal: Initial exercise prescription builds to 30-45 minutes a day of aerobic activity, 2-3 days per week.  Home exercise guidelines will be given to patient during program as part of exercise prescription that the participant will acknowledge.  Activity Barriers & Risk Stratification: Activity Barriers & Cardiac Risk Stratification - 01/09/18 1404    Activity Barriers & Cardiac Risk Stratification          Activity Barriers  None    Cardiac Risk Stratification  Moderate           6 Minute Walk: 6 Minute Walk    6 Minute Walk    Row Name 01/09/18 1337   Phase  Initial   Distance  1322 feet   Walk Time  6 minutes   # of Rest Breaks  0   MPH  2.5   METS  2.56   RPE  11   VO2 Peak  8.96   Symptoms  No   Resting HR  85 bpm   Resting BP  124/82   Resting Oxygen Saturation   98 %    Exercise Oxygen Saturation  during 6 min walk  97 %   Max Ex. HR  105 bpm   Max Ex. BP  124/86   2 Minute Post BP  100/74          Oxygen Initial Assessment:   Oxygen Re-Evaluation:   Oxygen Discharge (Final Oxygen Re-Evaluation):   Initial Exercise Prescription: Initial Exercise Prescription - 01/09/18 1400    Date of Initial Exercise RX and Referring Provider          Date  01/09/18    Referring Provider  Quay Burow, MD        Bike          Level  0.8    Minutes  10    METs  2.51        NuStep          Level  2    SPM  85    Minutes  10    METs  2.5        Track          Laps  11    Minutes  10    METs  2.92        Prescription Details          Frequency (times per week)  2    Duration  Progress to 30 minutes of continuous aerobic without signs/symptoms of physical distress        Intensity          THRR 40-80% of Max Heartrate  61-122    Ratings of Perceived Exertion  11-13    Perceived Dyspnea  0-4        Progression          Progression  Continue to progress workloads to maintain intensity without signs/symptoms of physical distress.        Resistance Training          Training Prescription  Yes    Weight  2lbs    Reps  10-15           Perform Capillary Blood Glucose checks as needed.  Exercise Prescription Changes:   Exercise Comments:   Exercise Goals and Review: Exercise Goals    Exercise Goals    Row Name 01/09/18 1405   Increase Physical Activity  Yes   Intervention  Provide advice, education, support and counseling about physical activity/exercise needs.;Develop an individualized exercise prescription for aerobic and resistive training based on initial evaluation findings, risk  stratification, comorbidities and participant's personal goals.   Expected Outcomes  Short Term: Attend rehab on a regular basis to increase amount of physical activity.;Long Term: Exercising regularly at least 3-5 days a week.;Long Term:  Add in home exercise to make exercise part of routine and to increase amount of physical activity.   Increase Strength and Stamina  Yes   Intervention  Provide advice, education, support and counseling about physical activity/exercise needs.;Develop an individualized exercise prescription for aerobic and resistive training based on initial evaluation findings, risk stratification, comorbidities and participant's personal goals.   Expected Outcomes  Short Term: Increase workloads from initial exercise prescription for resistance, speed, and METs.;Short Term: Perform resistance training exercises routinely during rehab and add in resistance training at home;Long Term: Improve cardiorespiratory fitness, muscular endurance and strength as measured by increased METs and functional capacity (6MWT)   Able to understand and use rate of perceived exertion (RPE) scale  Yes   Intervention  Provide education and explanation on how to use RPE scale   Expected Outcomes  Short Term: Able to use RPE daily in rehab to express subjective intensity level;Long Term:  Able to use RPE to guide intensity level when exercising independently   Knowledge and understanding of Target Heart Rate Range (THRR)  Yes   Intervention  Provide education and explanation of THRR including how the numbers were predicted and where they are located for reference   Expected Outcomes  Short Term: Able to state/look up THRR;Long Term: Able to use THRR to govern intensity when exercising independently;Short Term: Able to use daily as guideline for intensity in rehab   Able to check pulse independently  Yes   Intervention  Provide education and demonstration on how to check pulse in carotid and radial arteries.;Review the importance of being able to check your own pulse for safety during independent exercise   Expected Outcomes  Short Term: Able to explain why pulse checking is important during independent exercise;Long Term: Able to check pulse  independently and accurately   Understanding of Exercise Prescription  Yes   Intervention  Provide education, explanation, and written materials on patient's individual exercise prescription   Expected Outcomes  Short Term: Able to explain program exercise prescription;Long Term: Able to explain home exercise prescription to exercise independently          Exercise Goals Re-Evaluation :   Discharge Exercise Prescription (Final Exercise Prescription Changes):   Nutrition:  Target Goals: Understanding of nutrition guidelines, daily intake of sodium 1500mg , cholesterol 200mg , calories 30% from fat and 7% or less from saturated fats, daily to have 5 or more servings of fruits and vegetables.  Biometrics: Pre Biometrics - 01/09/18 1337    Pre Biometrics          Height  5\' 5"  (1.651 m)    Weight  100 kg    Waist Circumference  45.5 inches    Hip Circumference  44.5 inches    Waist to Hip Ratio  1.02 %    BMI (Calculated)  36.69    Triceps Skinfold  20 mm    % Body Fat  35.2 %    Grip Strength  33.5 kg    Flexibility  8.5 in    Single Leg Stand  15.22 seconds            Nutrition Therapy Plan and Nutrition Goals:   Nutrition Assessments:   Nutrition Goals Re-Evaluation:   Nutrition Goals Re-Evaluation:   Nutrition Goals Discharge (Final Nutrition Goals  Re-Evaluation):   Psychosocial: Target Goals: Acknowledge presence or absence of significant depression and/or stress, maximize coping skills, provide positive support system. Participant is able to verbalize types and ability to use techniques and skills needed for reducing stress and depression.  Initial Review & Psychosocial Screening: Initial Psych Review & Screening - 01/09/18 1517    Initial Review          Current issues with  None Identified        Family Dynamics          Good Support System?  Yes   family, church, friends    Comments  no psychosocial needs identified, no interventions necessary          Barriers          Psychosocial barriers to participate in program  There are no identifiable barriers or psychosocial needs.        Screening Interventions          Interventions  Encouraged to exercise           Quality of Life Scores: Quality of Life - 01/09/18 1518    Quality of Life          Select  --   did not complete questionnaire         Scores of 19 and below usually indicate a poorer quality of life in these areas.  A difference of  2-3 points is a clinically meaningful difference.  A difference of 2-3 points in the total score of the Quality of Life Index has been associated with significant improvement in overall quality of life, self-image, physical symptoms, and general health in studies assessing change in quality of life.  PHQ-9: Recent Review Flowsheet Data    There is no flowsheet data to display.     Interpretation of Total Score  Total Score Depression Severity:  1-4 = Minimal depression, 5-9 = Mild depression, 10-14 = Moderate depression, 15-19 = Moderately severe depression, 20-27 = Severe depression   Psychosocial Evaluation and Intervention:   Psychosocial Re-Evaluation:   Psychosocial Discharge (Final Psychosocial Re-Evaluation):   Vocational Rehabilitation: Provide vocational rehab assistance to qualifying candidates.   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 Participant will state understanding/return demonstration of topics presented.  Learning Barriers/Preferences: Learning Barriers/Preferences - 01/09/18 1518    Learning Barriers/Preferences          Learning Barriers  None     Learning Preferences  None           Education Topics: Count Your Pulse:  -Group instruction provided by verbal instruction, demonstration, patient participation and written materials to support subject.  Instructors address importance of being able to find your pulse and how to count your pulse when at home without a heart monitor.  Patients get hands on experience counting their pulse with staff help and individually.   Heart Attack, Angina, and Risk Factor Modification:  -Group instruction provided by verbal instruction, video, and written materials to support subject.  Instructors address signs and symptoms of angina and heart attacks.    Also discuss risk factors for  heart disease and how to make changes to improve heart health risk factors.   Functional Fitness:  -Group instruction provided by verbal instruction, demonstration, patient participation, and written materials to support subject.  Instructors address safety measures for doing things around the house.  Discuss how to get up and down off the floor, how to pick things up properly, how to safely get out of a chair without assistance, and balance training.   Meditation and Mindfulness:  -Group instruction provided by verbal instruction, patient participation, and written materials to support subject.  Instructor addresses importance of mindfulness and meditation practice to help reduce stress and improve awareness.  Instructor also leads participants through a meditation exercise.    Stretching for Flexibility and Mobility:  -Group instruction provided by verbal instruction, patient participation, and written materials to support subject.  Instructors lead participants through series of stretches that are designed to increase flexibility thus improving mobility.  These stretches are additional exercise for major muscle groups that are typically performed during regular warm up and cool down.   Hands Only CPR:  -Group  verbal, video, and participation provides a basic overview of AHA guidelines for community CPR. Role-play of emergencies allow participants the opportunity to practice calling for help and chest compression technique with discussion of AED use.   Hypertension: -Group verbal and written instruction that provides a basic overview of hypertension including the most recent diagnostic guidelines, risk factor reduction with self-care instructions and medication management.    Nutrition I class: Heart Healthy Eating:  -Group instruction provided by PowerPoint slides, verbal discussion, and written materials to support subject matter. The instructor gives an explanation and review of the Therapeutic Lifestyle Changes diet recommendations, which includes a discussion on lipid goals, dietary fat, sodium, fiber, plant stanol/sterol esters, sugar, and the components of a well-balanced, healthy diet.   Nutrition II class: Lifestyle Skills:  -Group instruction provided by PowerPoint slides, verbal discussion, and written materials to support subject matter. The instructor gives an explanation and review of label reading, grocery shopping for heart health, heart healthy recipe modifications, and ways to make healthier choices when eating out.   Diabetes Question & Answer:  -Group instruction provided by PowerPoint slides, verbal discussion, and written materials to support subject matter. The instructor gives an explanation and review of diabetes co-morbidities, pre- and post-prandial blood glucose goals, pre-exercise blood glucose goals, signs, symptoms, and treatment of hypoglycemia and hyperglycemia, and foot care basics.   Diabetes Blitz:  -Group instruction provided by PowerPoint slides, verbal discussion, and written materials to support subject matter. The instructor gives an explanation and review of the physiology behind type 1 and type 2 diabetes, diabetes medications and rational behind using  different medications, pre- and post-prandial blood glucose recommendations and Hemoglobin A1c goals, diabetes diet, and exercise including blood glucose guidelines for exercising safely.    Portion Distortion:  -Group instruction provided by PowerPoint slides, verbal discussion, written materials, and food models to support subject matter. The instructor gives an explanation of serving size versus portion size, changes in portions sizes over the last 20 years, and what consists of a serving from each food group.   Stress Management:  -Group instruction provided by verbal instruction, video, and written materials to support subject matter.  Instructors review role of stress in heart disease and how to cope with stress positively.     Exercising on Your Own:  -Group instruction provided by verbal instruction, power point, and written materials to support subject.  Instructors  discuss benefits of exercise, components of exercise, frequency and intensity of exercise, and end points for exercise.  Also discuss use of nitroglycerin and activating EMS.  Review options of places to exercise outside of rehab.  Review guidelines for sex with heart disease.   Cardiac Drugs I:  -Group instruction provided by verbal instruction and written materials to support subject.  Instructor reviews cardiac drug classes: antiplatelets, anticoagulants, beta blockers, and statins.  Instructor discusses reasons, side effects, and lifestyle considerations for each drug class.   Cardiac Drugs II:  -Group instruction provided by verbal instruction and written materials to support subject.  Instructor reviews cardiac drug classes: angiotensin converting enzyme inhibitors (ACE-I), angiotensin II receptor blockers (ARBs), nitrates, and calcium channel blockers.  Instructor discusses reasons, side effects, and lifestyle considerations for each drug class.   Anatomy and Physiology of the Circulatory System:  Group verbal and  written instruction and models provide basic cardiac anatomy and physiology, with the coronary electrical and arterial systems. Review of: AMI, Angina, Valve disease, Heart Failure, Peripheral Artery Disease, Cardiac Arrhythmia, Pacemakers, and the ICD.   Other Education:  -Group or individual verbal, written, or video instructions that support the educational goals of the cardiac rehab program.   Holiday Eating Survival Tips:  -Group instruction provided by PowerPoint slides, verbal discussion, and written materials to support subject matter. The instructor gives patients tips, tricks, and techniques to help them not only survive but enjoy the holidays despite the onslaught of food that accompanies the holidays.   Knowledge Questionnaire Score: Knowledge Questionnaire Score - 01/09/18 1518    Knowledge Questionnaire Score          Pre Score  20/24           Core Components/Risk Factors/Patient Goals at Admission: Personal Goals and Risk Factors at Admission - 01/09/18 1544    Core Components/Risk Factors/Patient Goals on Admission           Weight Management  Yes;Obesity    Intervention  Weight Management: Develop a combined nutrition and exercise program designed to reach desired caloric intake, while maintaining appropriate intake of nutrient and fiber, sodium and fats, and appropriate energy expenditure required for the weight goal.;Weight Management: Provide education and appropriate resources to help participant work on and attain dietary goals.;Weight Management/Obesity: Establish reasonable short term and long term weight goals.;Obesity: Provide education and appropriate resources to help participant work on and attain dietary goals.    Admit Weight  220 lb 7.4 oz (100 kg)    Goal Weight: Short Term  214 lb (97.1 kg)    Goal Weight: Long Term  208 lb (94.3 kg)    Expected Outcomes  Short Term: Continue to assess and modify interventions until short term weight is achieved;Long  Term: Adherence to nutrition and physical activity/exercise program aimed toward attainment of established weight goal;Weight Loss: Understanding of general recommendations for a balanced deficit meal plan, which promotes 1-2 lb weight loss per week and includes a negative energy balance of [phone] kcal/d;Understanding recommendations for meals to include 15-35% energy as protein, 25-35% energy from fat, 35-60% energy from carbohydrates, less than 200mg  of dietary cholesterol, 20-35 gm of total fiber daily;Understanding of distribution of calorie intake throughout the day with the consumption of 4-5 meals/snacks    Diabetes  Yes    Intervention  Provide education about signs/symptoms and action to take for hypo/hyperglycemia.;Provide education about proper nutrition, including hydration, and aerobic/resistive exercise prescription along with prescribed medications to achieve blood glucose in  normal ranges: Fasting glucose 65-99 mg/dL    Expected Outcomes  Short Term: Participant verbalizes understanding of the signs/symptoms and immediate care of hyper/hypoglycemia, proper foot care and importance of medication, aerobic/resistive exercise and nutrition plan for blood glucose control.;Long Term: Attainment of HbA1C < 7%.    Lipids  Yes    Intervention  Provide education and support for participant on nutrition & aerobic/resistive exercise along with prescribed medications to achieve LDL 70mg , HDL >40mg .    Expected Outcomes  Short Term: Participant states understanding of desired cholesterol values and is compliant with medications prescribed. Participant is following exercise prescription and nutrition guidelines.;Long Term: Cholesterol controlled with medications as prescribed, with individualized exercise RX and with personalized nutrition plan. Value goals: LDL < 70mg , HDL > 40 mg.           Core Components/Risk Factors/Patient Goals Review:    Core Components/Risk Factors/Patient Goals at  Discharge (Final Review):    ITP Comments: ITP Comments    Row Name 01/09/18 1358   ITP Comments  Medical Director- Dr. Fransico Him, MD      Comments: Patient attended orientation from 1333to 1438 to review rules and guidelines for program. Completed 6 minute walk test, Intitial ITP, and exercise prescription.  VSS. Telemetry-sinus rhythm,  Asymptomatic. Andi Hence, RN, BSN Cardiac Pulmonary Rehab  01/09/18 ,4:20 PM

## 2018-01-10 NOTE — Progress Notes (Signed)
Connor Wright 67 y.o. male DOB: 27-Sep-1950 MRN: [id number]      Nutrition Note  1. 11/09/2017 Stented coronary artery-CFX    Past Medical History:  Diagnosis Date  . Anxiety   . Coronary artery disease   . GERD (gastroesophageal reflux disease)   . High cholesterol   . Type II diabetes mellitus ([facility])    Meds reviewed. Lipitor, metformin, toprol, protonix noted  HT: Ht Readings from Last 1 Encounters:  01/09/18 5\' 5"  (1.651 m)    WT: Wt Readings from Last 5 Encounters:  01/09/18 220 lb 7.4 oz (100 kg)  12/27/17 218 lb (98.9 kg)  12/21/17 218 lb (98.9 kg)  11/20/17 216 lb 12.8 oz (98.3 kg)  11/10/17 217 lb 6 oz (98.6 kg)     Body mass index is 36.69 kg/m.   Current tobacco use? No       Labs:  Lipid Panel     Component Value Date/Time   CHOL 138 11/10/2017 0720   TRIG 245 (H) 11/10/2017 0720   HDL 31 (L) 11/10/2017 0720   CHOLHDL 4.5 11/10/2017 0720   VLDL 49 (H) 11/10/2017 0720   LDLCALC 58 11/10/2017 0720    No results found for: HGBA1C CBG (last 3)  No results for input(s): GLUCAP in the last 72 hours.  Nutrition Note Pt is following Step 1 of the Therapeutic Lifestyle Changes diet. Pt wants to lose an unspecified amount of wt. Pt is diabetic. Don't have pt's last A1C, requested pt bring with him to start of cardiac rehab. Pt does not use canned/convenience foods often. Pt rarely adds salt to food. Pt eats out infrequently.   Nutrition Diagnosis ? Food-and nutrition-related knowledge deficit related to lack of exposure to information as related to diagnosis of: ? CVD ? DM  ? Obesity related to excessive energy intake as evidenced by a Body mass index is 36.69 kg/m.  Nutrition Intervention ? Pt's individual nutrition plan and goals reviewed with pt. ? Pt given handouts for: ? Nutrition I class ? Nutrition II class  ? Diabetes Blitz Class ? Diabetes Q & A class  ? Consistent vit K diet ? low sodium ? DM ? pre-diabetes  Nutrition Goal(s):   ? Pt  to identify and limit food sources of saturated fat, trans fat, refined carbohydrates and sodium ? Pt to identify food quantities necessary to achieve weight loss of 6-24 lbs. at graduation from cardiac rehab.   Plan:  ? Pt to attend nutrition classes ? Nutrition I ? Nutrition II ? Portion Distortion  ? Diabetes Blitz ? Diabetes Q & Ae determined ? Will provide client-centered nutrition education as part of interdisciplinary care ? Monitor and evaluate progress toward nutrition goal with team.   Laurina Bustle, MS, RD, LDN 01/10/2018 8:32 AM

## 2018-01-15 ENCOUNTER — Encounter (HOSPITAL_COMMUNITY)
Admission: RE | Admit: 2018-01-15 | Discharge: 2018-01-15 | Disposition: A | Discharge status: Home / Self Care | Payer: PPO | Source: Ambulatory Visit | Attending: Cardiovascular Disease | Admitting: Cardiovascular Disease

## 2018-01-15 ENCOUNTER — Encounter (HOSPITAL_COMMUNITY): Payer: PPO

## 2018-01-15 DIAGNOSIS — Z955 Presence of coronary angioplasty implant and graft: Secondary | ICD-10-CM

## 2018-01-15 LAB — GLUCOSE, CAPILLARY
Glucose-Capillary: 101 mg/dL — ABNORMAL HIGH (ref 70–99)
Glucose-Capillary: 117 mg/dL — ABNORMAL HIGH (ref 70–99)

## 2018-01-15 NOTE — Progress Notes (Signed)
Daily Session Note  Patient Details  Name: Terez Freimark MRN: [id number] Date of Birth: 05-06-1951 Referring Provider:     [place] from 01/09/2018 in [place]  Referring Provider  Quay Burow, MD      Encounter Date: 01/15/2018  Check In: Session Check In - 01/15/18 1459      Check-In   Supervising physician immediately available to respond to emergencies  [facility] immediately available    Physician(s)  Dr. Florene Glen    Location  [facility]    Staff Present  Andi Hence, RN, Deland Pretty, MS, ACSM CEP, Exercise Physiologist;Molly DiVincenzo, MS, ACSM RCEP, Exercise Physiologist;Tyara Nevels, MS,ACSM CEP, Exercise Physiologist    Medication changes reported      No    Fall or balance concerns reported     No    Tobacco Cessation  No Change    Warm-up and Cool-down  Performed as group-led instruction    Resistance Training Performed  Yes    VAD Patient?  No    PAD/SET Patient?  No      Pain Assessment   Currently in Pain?  No/denies    Multiple Pain Sites  No       Capillary Blood Glucose: Results for orders placed or performed during the hospital encounter of 01/15/18 (from the past 24 hour(s))  Glucose, capillary     Status: Abnormal   Collection Time: 01/15/18  2:54 PM  Result Value Ref Range   Glucose-Capillary 101 (H) 70 - 99 mg/dL  Glucose, capillary     Status: Abnormal   Collection Time: 01/15/18  3:47 PM  Result Value Ref Range   Glucose-Capillary 117 (H) 70 - 99 mg/dL      Social History   Tobacco Use  Smoking Status Former Smoker  . Packs/day: 1.00  . Years: 35.00  . Pack years: 35.00  . Types: Cigarettes  . Last attempt to quit: 2007  . Years since quitting: 12.6  Smokeless Tobacco Never Used    Goals Met:  Exercise tolerated well  Goals Unmet:  Not Applicable  Comments: Sam started cardiac rehab today.  Pt tolerated light exercise without  difficulty. VSS, telemetry-Sinus Rhythm, asymptomatic.  Medication list reconciled. Pt denies barriers to medicaiton compliance.  PSYCHOSOCIAL ASSESSMENT:  PHQ-0. Pt exhibits positive coping skills, hopeful outlook with supportive family. No psychosocial needs identified at this time, no psychosocial interventions necessary.    Pt enjoys travelling.   Pt oriented to exercise equipment and routine.    Understanding verbalized.Barnet Pall, RN,BSN 01/15/2018 4:46 PM   Dr. Fransico Him is Medical Director for Cardiac Rehab at [facility].

## 2018-01-17 ENCOUNTER — Encounter (HOSPITAL_COMMUNITY)
Admission: RE | Admit: 2018-01-17 | Discharge: 2018-01-17 | Disposition: A | Discharge status: Home / Self Care | Payer: PPO | Source: Ambulatory Visit | Attending: Cardiovascular Disease | Admitting: Cardiovascular Disease

## 2018-01-17 ENCOUNTER — Encounter (HOSPITAL_COMMUNITY): Payer: PPO

## 2018-01-17 DIAGNOSIS — Z955 Presence of coronary angioplasty implant and graft: Secondary | ICD-10-CM

## 2018-01-17 LAB — GLUCOSE, CAPILLARY
GLUCOSE-CAPILLARY: 81 mg/dL (ref 70–99)
Glucose-Capillary: 95 mg/dL (ref 70–99)
Glucose-Capillary: 117 mg/dL — ABNORMAL HIGH (ref 70–99)

## 2018-01-17 NOTE — Progress Notes (Signed)
Connor Wright 67 y.o. male DOB: 09-10-1950 MRN: [id number]      Nutrition Note  1. 11/09/2017 Stented coronary artery-CFX    Past Medical History:  Diagnosis Date  . Anxiety   . Coronary artery disease   . GERD (gastroesophageal reflux disease)   . High cholesterol   . Type II diabetes mellitus ([place])    Meds reviewed. Lipitor, metformin, toprol, protonix noted  HT: Ht Readings from Last 1 Encounters:  01/09/18 5\' 5"  (1.651 m)    WT: Wt Readings from Last 5 Encounters:  01/09/18 220 lb 7.4 oz (100 kg)  12/27/17 218 lb (98.9 kg)  12/21/17 218 lb (98.9 kg)  11/20/17 216 lb 12.8 oz (98.3 kg)  11/10/17 217 lb 6 oz (98.6 kg)     Body mass index is 36.69 kg/m.   Current tobacco use? No       Labs:  Lipid Panel     Component Value Date/Time   CHOL 138 11/10/2017 0720   TRIG 245 (H) 11/10/2017 0720   HDL 31 (L) 11/10/2017 0720   CHOLHDL 4.5 11/10/2017 0720   VLDL 49 (H) 11/10/2017 0720   LDLCALC 58 11/10/2017 0720    No results found for: HGBA1C CBG (last 3)  No results for input(s): GLUCAP in the last 72 hours.  Nutrition Note Pt is following Step 1 of the Therapeutic Lifestyle Changes diet. Pt wants to lose about 20 lbs, pt has not actively been making any changes. Discussed weight loss tips today (label reading, how to build a healthy plate, portion control) with patient. Pt is diabetic. Don't have pt's last A1C, requested pt bring with him to next session of cardiac rehab. Pt does not use canned/convenience foods often. Pt rarely adds salt to food. Pt eats out infrequently.   Nutrition Diagnosis  ? Obesity related to excessive energy intake as evidenced by a Body mass index is 36.69 kg/m.  Nutrition Intervention ? Pt's individual nutrition plan and goals reviewed with pt.   Nutrition Goal(s):   ? Pt to identify and limit food sources of saturated fat, trans fat, refined carbohydrates and sodium ? Pt to identify food quantities necessary to achieve  weight loss of 6-24 lbs. at graduation from cardiac rehab.   Plan:  ? Pt to attend nutrition classes ? Nutrition I ? Nutrition II ? Portion Distortion  ? Diabetes Blitz ? Diabetes Q & Ae determined ? Will provide client-centered nutrition education as part of interdisciplinary care ? Monitor and evaluate progress toward nutrition goal with team.

## 2018-01-19 ENCOUNTER — Encounter (HOSPITAL_COMMUNITY): Payer: PPO

## 2018-01-22 ENCOUNTER — Encounter (HOSPITAL_COMMUNITY)
Admission: RE | Admit: 2018-01-22 | Discharge: 2018-01-22 | Disposition: A | Discharge status: Home / Self Care | Payer: PPO | Source: Ambulatory Visit | Attending: Cardiovascular Disease | Admitting: Cardiovascular Disease

## 2018-01-22 ENCOUNTER — Encounter (HOSPITAL_COMMUNITY): Payer: PPO

## 2018-01-22 DIAGNOSIS — Z955 Presence of coronary angioplasty implant and graft: Secondary | ICD-10-CM

## 2018-01-23 LAB — GLUCOSE, CAPILLARY: Glucose-Capillary: 166 mg/dL — ABNORMAL HIGH (ref 70–99)

## 2018-01-24 ENCOUNTER — Encounter (HOSPITAL_COMMUNITY)
Admission: RE | Admit: 2018-01-24 | Discharge: 2018-01-24 | Disposition: A | Discharge status: Home / Self Care | Payer: PPO | Source: Ambulatory Visit | Attending: Cardiovascular Disease | Admitting: Cardiovascular Disease

## 2018-01-24 ENCOUNTER — Encounter (HOSPITAL_COMMUNITY): Payer: PPO

## 2018-01-24 DIAGNOSIS — Z955 Presence of coronary angioplasty implant and graft: Secondary | ICD-10-CM

## 2018-01-24 LAB — GLUCOSE, CAPILLARY: Glucose-Capillary: 93 mg/dL (ref 70–99)

## 2018-01-26 ENCOUNTER — Encounter (HOSPITAL_COMMUNITY): Payer: PPO

## 2018-01-31 ENCOUNTER — Encounter (HOSPITAL_COMMUNITY): Payer: PPO

## 2018-01-31 ENCOUNTER — Encounter (HOSPITAL_COMMUNITY)
Admission: RE | Admit: 2018-01-31 | Discharge: 2018-01-31 | Disposition: A | Discharge status: Home / Self Care | Payer: PPO | Source: Ambulatory Visit | Attending: Cardiovascular Disease | Admitting: Cardiovascular Disease

## 2018-01-31 DIAGNOSIS — Z87891 Personal history of nicotine dependence: Secondary | ICD-10-CM | POA: Insufficient documentation

## 2018-01-31 DIAGNOSIS — E119 Type 2 diabetes mellitus without complications: Secondary | ICD-10-CM | POA: Diagnosis not present

## 2018-01-31 DIAGNOSIS — Z7902 Long term (current) use of antithrombotics/antiplatelets: Secondary | ICD-10-CM | POA: Insufficient documentation

## 2018-01-31 DIAGNOSIS — Z7982 Long term (current) use of aspirin: Secondary | ICD-10-CM | POA: Insufficient documentation

## 2018-01-31 DIAGNOSIS — K219 Gastro-esophageal reflux disease without esophagitis: Secondary | ICD-10-CM | POA: Insufficient documentation

## 2018-01-31 DIAGNOSIS — E7800 Pure hypercholesterolemia, unspecified: Secondary | ICD-10-CM | POA: Diagnosis not present

## 2018-01-31 DIAGNOSIS — F419 Anxiety disorder, unspecified: Secondary | ICD-10-CM | POA: Insufficient documentation

## 2018-01-31 DIAGNOSIS — Z79899 Other long term (current) drug therapy: Secondary | ICD-10-CM | POA: Insufficient documentation

## 2018-01-31 DIAGNOSIS — I2510 Atherosclerotic heart disease of native coronary artery without angina pectoris: Secondary | ICD-10-CM | POA: Diagnosis not present

## 2018-01-31 DIAGNOSIS — Z7984 Long term (current) use of oral hypoglycemic drugs: Secondary | ICD-10-CM | POA: Diagnosis not present

## 2018-01-31 DIAGNOSIS — Z955 Presence of coronary angioplasty implant and graft: Secondary | ICD-10-CM | POA: Diagnosis not present

## 2018-01-31 NOTE — Progress Notes (Signed)
Reviewed home exercise guidelines with patient including endpoints, temperature precautions, target heart rate and rate of perceived exertion. Pt is walking 30 minutes daily and swimming occasionally as his mode of home exercise. Pt voices understanding of instructions given. Sol Passer, MS, ACSM CEP

## 2018-01-31 NOTE — Progress Notes (Signed)
Cardiac Individual Treatment Plan   Patient Details  Name: Connor Wright MRN: [id number] Date of Birth: July 13, 1950 Referring Provider:     CARDIAC REHAB PHASE II ORIENTATION from 01/09/2018 in [facility]  Referring Provider  Quay Burow, MD      Initial Encounter Date:    CARDIAC REHAB PHASE II ORIENTATION from 01/09/2018 in [facility]  Date  01/09/18      Visit Diagnosis: 11/09/2017 Stented coronary artery-CFX  Patient's Home Medications on Admission:  Current Outpatient Medications:  .  ALPRAZolam (XANAX) 0.25 MG tablet, Take 0.25 mg by mouth 3 (three) times daily. , Disp: , Rfl:  .  Artificial Tear Ointment (DRY EYES OP), Place 1 drop into both eyes daily as needed (for dry eyes)., Disp: , Rfl:  .  aspirin 81 MG tablet, Take 81 mg by mouth daily., Disp: , Rfl:  .  atorvastatin (LIPITOR) 80 MG tablet, Take 80 mg by mouth daily., Disp: , Rfl:  .  clopidogrel (PLAVIX) 75 MG tablet, Take 1 tablet (75 mg total) by mouth daily with breakfast., Disp: 90 tablet, Rfl: 3 .  cyclobenzaprine (FLEXERIL) 5 MG tablet, Take 1 tablet (5 mg total) by mouth at bedtime as needed for muscle spasms. (Patient not taking: Reported on 01/04/2018), Disp: 10 tablet, Rfl: 0 .  LUTEIN PO, Take 1 capsule by mouth daily., Disp: , Rfl:  .  metFORMIN (GLUCOPHAGE) 500 MG tablet, Take 1 tablet (500 mg total) by mouth 2 (two) times daily with a meal. Resume in 48 hrs., Disp: , Rfl:  .  metoprolol succinate (TOPROL-XL) 50 MG 24 hr tablet, Take 50 mg by mouth daily., Disp: , Rfl: 5 .  nitroGLYCERIN (NITROSTAT) 0.4 MG SL tablet, Place 0.4 mg under the tongue every 5 (five) minutes as needed for chest pain. , Disp: , Rfl: 5 .  pantoprazole (PROTONIX) 40 MG tablet, Take 1 tablet (40 mg total) by mouth daily., Disp: 90 tablet, Rfl: 3 .  tamsulosin (FLOMAX) 0.4 MG CAPS capsule, Take 0.4 mg by mouth daily., Disp: , Rfl:   Past Medical History: Past  Medical History:  Diagnosis Date  . Anxiety   . Coronary artery disease   . GERD (gastroesophageal reflux disease)   . High cholesterol   . Type II diabetes mellitus (HCC)     Tobacco Use: Social History   Tobacco Use  Smoking Status Former Smoker  . Packs/day: 1.00  . Years: 35.00  . Pack years: 35.00  . Types: Cigarettes  . Last attempt to quit: 2007  . Years since quitting: 12.6  Smokeless Tobacco Never Used    Labs: Recent Review Scientist, physiological    Labs for ITP Cardiac and Pulmonary Rehab Latest Ref Rng & Units 11/10/2017   Cholestrol 0 - 200 mg/dL 138   LDLCALC 0 - 99 mg/dL 58   HDL >40 mg/dL 31(L)   Trlycerides <150 mg/dL 245(H)      Capillary Blood Glucose: Lab Results  Component Value Date   GLUCAP 93 01/24/2018   GLUCAP 166 (H) 01/22/2018   GLUCAP 117 (H) 01/17/2018   GLUCAP 81 01/17/2018   GLUCAP 95 01/17/2018     Exercise Target Goals: Exercise Program Goal: Individual exercise prescription set using results from initial 6 min walk test and THRR while considering  patient's activity barriers and safety.   Exercise Prescription Goal: Starting with aerobic activity 30 plus minutes a day, 3 days per week for initial exercise prescription.  Provide home exercise prescription and guidelines that participant acknowledges understanding prior to discharge.  Activity Barriers & Risk Stratification: Activity Barriers & Cardiac Risk Stratification - 01/09/18 1404      Activity Barriers & Cardiac Risk Stratification   Activity Barriers  None    Cardiac Risk Stratification  Moderate       6 Minute Walk: 6 Minute Walk    Row Name 01/09/18 1337         6 Minute Walk   Phase  Initial     Distance  1322 feet     Walk Time  6 minutes     # of Rest Breaks  0     MPH  2.5     METS  2.56     RPE  11     VO2 Peak  8.96     Symptoms  No     Resting HR  85 bpm     Resting BP  124/82     Resting Oxygen Saturation   98 %     Exercise Oxygen Saturation   during 6 min walk  97 %     Max Ex. HR  105 bpm     Max Ex. BP  124/86     2 Minute Post BP  100/74        Oxygen Initial Assessment:   Oxygen Re-Evaluation:   Oxygen Discharge (Final Oxygen Re-Evaluation):   Initial Exercise Prescription: Initial Exercise Prescription - 01/09/18 1400      Date of Initial Exercise RX and Referring Provider   Date  01/09/18    Referring Provider  Quay Burow, MD      Bike   Level  0.8    Minutes  10    METs  2.51      NuStep   Level  2    SPM  85    Minutes  10    METs  2.5      Track   Laps  11    Minutes  10    METs  2.92      Prescription Details   Frequency (times per week)  2    Duration  Progress to 30 minutes of continuous aerobic without signs/symptoms of physical distress      Intensity   THRR 40-80% of Max Heartrate  61-122    Ratings of Perceived Exertion  11-13    Perceived Dyspnea  0-4      Progression   Progression  Continue to progress workloads to maintain intensity without signs/symptoms of physical distress.      Resistance Training   Training Prescription  Yes    Weight  2lbs    Reps  10-15       Perform Capillary Blood Glucose checks as needed.  Exercise Prescription Changes: Exercise Prescription Changes    Row Name 01/15/18 1444 01/22/18 1534           Response to Exercise   Blood Pressure (Admit)  122/72  104/70      Blood Pressure (Exercise)  142/80  150/80      Blood Pressure (Exit)  134/80  140/80      Heart Rate (Admit)  87 bpm  99 bpm      Heart Rate (Exercise)  113 bpm  119 bpm      Heart Rate (Exit)  87 bpm  98 bpm      Rating of Perceived Exertion (Exercise)  12  12  Symptoms  none  none      Duration  Progress to 30 minutes of  aerobic without signs/symptoms of physical distress  Progress to 30 minutes of  aerobic without signs/symptoms of physical distress      Intensity  THRR unchanged  THRR unchanged        Progression   Progression  Continue to progress  workloads to maintain intensity without signs/symptoms of physical distress.  Continue to progress workloads to maintain intensity without signs/symptoms of physical distress.      Average METs  2.4  2.6        Resistance Training   Training Prescription  Yes  Yes      Weight  3lbs  3lbs      Reps  10-15  10-15      Time  10 Minutes  10 Minutes        Interval Training   Interval Training  No  No        Bike   Level  -  -      Minutes  -  -      METs  -  -        Recumbant Bike   Level  1  2.5      Minutes  10  10      METs  -  2.5        NuStep   Level  2  2      SPM  85  85      Minutes  10  10      METs  2.4  3        Track   Laps  -  7      Minutes  -  10      METs  -  2.22         Exercise Comments: Exercise Comments    Row Name 01/15/18 1540 01/22/18 1534 01/31/18 1534       Exercise Comments  Patient tolerated low intensity fair. Pt had difficulty with upright, stationary bike, move to recumbent bike and tolerated well.  Reviewed METs and goals with patient.  Reviewed home exercise guidelines with patient.        Exercise Goals and Review: Exercise Goals    Row Name 01/09/18 1405             Exercise Goals   Increase Physical Activity  Yes       Intervention  Provide advice, education, support and counseling about physical activity/exercise needs.;Develop an individualized exercise prescription for aerobic and resistive training based on initial evaluation findings, risk stratification, comorbidities and participant's personal goals.       Expected Outcomes  Short Term: Attend rehab on a regular basis to increase amount of physical activity.;Long Term: Exercising regularly at least 3-5 days a week.;Long Term: Add in home exercise to make exercise part of routine and to increase amount of physical activity.       Increase Strength and Stamina  Yes       Intervention  Provide advice, education, support and counseling about physical activity/exercise  needs.;Develop an individualized exercise prescription for aerobic and resistive training based on initial evaluation findings, risk stratification, comorbidities and participant's personal goals.       Expected Outcomes  Short Term: Increase workloads from initial exercise prescription for resistance, speed, and METs.;Short Term: Perform resistance training exercises routinely during rehab and add in resistance training at home;Long Term:  Improve cardiorespiratory fitness, muscular endurance and strength as measured by increased METs and functional capacity (6MWT)       Able to understand and use rate of perceived exertion (RPE) scale  Yes       Intervention  Provide education and explanation on how to use RPE scale       Expected Outcomes  Short Term: Able to use RPE daily in rehab to express subjective intensity level;Long Term:  Able to use RPE to guide intensity level when exercising independently       Knowledge and understanding of Target Heart Rate Range (THRR)  Yes       Intervention  Provide education and explanation of THRR including how the numbers were predicted and where they are located for reference       Expected Outcomes  Short Term: Able to state/look up THRR;Long Term: Able to use THRR to govern intensity when exercising independently;Short Term: Able to use daily as guideline for intensity in rehab       Able to check pulse independently  Yes       Intervention  Provide education and demonstration on how to check pulse in carotid and radial arteries.;Review the importance of being able to check your own pulse for safety during independent exercise       Expected Outcomes  Short Term: Able to explain why pulse checking is important during independent exercise;Long Term: Able to check pulse independently and accurately       Understanding of Exercise Prescription  Yes       Intervention  Provide education, explanation, and written materials on patient's individual exercise prescription        Expected Outcomes  Short Term: Able to explain program exercise prescription;Long Term: Able to explain home exercise prescription to exercise independently          Exercise Goals Re-Evaluation : Exercise Goals Re-Evaluation    Row Name 01/15/18 1540 01/22/18 1534 01/31/18 1534         Exercise Goal Re-Evaluation   Exercise Goals Review  Able to understand and use rate of perceived exertion (RPE) scale  Increase Physical Activity  Understanding of Exercise Prescription;Knowledge and understanding of Target Heart Rate Range (THRR);Able to understand and use rate of perceived exertion (RPE) scale     Comments  Patient able to use and understand the RPE scale appropriately.  Patient is doing well with exercise at cardiac rehab. Pt staes that he is walking 20-30 minutes daily.  Reviewed home exercise guidelines with patient including THRR, RPE scale, and endpoitns for exercise. Pt is walking 30 minutes daily, and swimming occassionally as his mode of home exercise.     Expected Outcomes  Increase workloads as tolerated.  Patient will continue daily walking in addition to exercise at cardiac reahb to help improve strength and stamina.  Increase workloads as tolerated to help achieve health and fitness goals.         Discharge Exercise Prescription (Final Exercise Prescription Changes): Exercise Prescription Changes - 01/22/18 1534      Response to Exercise   Blood Pressure (Admit)  104/70    Blood Pressure (Exercise)  150/80    Blood Pressure (Exit)  140/80    Heart Rate (Admit)  99 bpm    Heart Rate (Exercise)  119 bpm    Heart Rate (Exit)  98 bpm    Rating of Perceived Exertion (Exercise)  12    Symptoms  none    Duration  Progress  to 30 minutes of  aerobic without signs/symptoms of physical distress    Intensity  THRR unchanged      Progression   Progression  Continue to progress workloads to maintain intensity without signs/symptoms of physical distress.    Average METs  2.6       Resistance Training   Training Prescription  Yes    Weight  3lbs    Reps  10-15    Time  10 Minutes      Interval Training   Interval Training  No      Recumbant Bike   Level  2.5    Minutes  10    METs  2.5      NuStep   Level  2    SPM  85    Minutes  10    METs  3      Track   Laps  7    Minutes  10    METs  2.22       Nutrition:  Target Goals: Understanding of nutrition guidelines, daily intake of sodium 1500mg , cholesterol 200mg , calories 30% from fat and 7% or less from saturated fats, daily to have 5 or more servings of fruits and vegetables.  Biometrics: Pre Biometrics - 01/09/18 1337      Pre Biometrics   Height  5\' 5"  (1.651 m)    Weight  100 kg    Waist Circumference  45.5 inches    Hip Circumference  44.5 inches    Waist to Hip Ratio  1.02 %    BMI (Calculated)  36.69    Triceps Skinfold  20 mm    % Body Fat  35.2 %    Grip Strength  33.5 kg    Flexibility  8.5 in    Single Leg Stand  15.22 seconds        Nutrition Therapy Plan and Nutrition Goals: Nutrition Therapy & Goals - 01/10/18 0840      Nutrition Therapy   Diet  consistent carbohydrate heart healthy      Personal Nutrition Goals   Nutrition Goal  Pt to identify and limit food sources of saturated fat, trans fat, refined carbohydrates and sodium    Personal Goal #2  Pt to identify food quantities necessary to achieve weight loss of 6-24 lbs. at graduation from cardiac rehab.      Intervention Plan   Intervention  Prescribe, educate and counsel regarding individualized specific dietary modifications aiming towards targeted core components such as weight, hypertension, lipid management, diabetes, heart failure and other comorbidities.    Expected Outcomes  Short Term Goal: Understand basic principles of dietary content, such as calories, fat, sodium, cholesterol and nutrients.       Nutrition Assessments: Nutrition Assessments - 01/10/18 0843      MEDFICTS Scores   Pre  Score  49       Nutrition Goals Re-Evaluation:   Nutrition Goals Discharge (Final Nutrition Goals Re-Evaluation):   Psychosocial: Target Goals: Acknowledge presence or absence of significant depression and/or stress, maximize coping skills, provide positive support system. Participant is able to verbalize types and ability to use techniques and skills needed for reducing stress and depression.  Initial Review & Psychosocial Screening: Initial Psych Review & Screening - 01/09/18 1517      Initial Review   Current issues with  None Identified      Family Dynamics   Good Support System?  Yes   family, church, friends  Comments  no psychosocial needs identified, no interventions necessary       Barriers   Psychosocial barriers to participate in program  There are no identifiable barriers or psychosocial needs.      Screening Interventions   Interventions  Encouraged to exercise       Quality of Life Scores: Quality of Life - 01/09/18 1518      Quality of Life   Select  --   did not complete questionnaire     Scores of 19 and below usually indicate a poorer quality of life in these areas.  A difference of  2-3 points is a clinically meaningful difference.  A difference of 2-3 points in the total score of the Quality of Life Index has been associated with significant improvement in overall quality of life, self-image, physical symptoms, and general health in studies assessing change in quality of life.  PHQ-9: Recent Review Flowsheet Data    Depression screen [facility] 2/9 01/15/2018   Decreased Interest 0   Down, Depressed, Hopeless 0   PHQ - 2 Score 0     Interpretation of Total Score  Total Score Depression Severity:  1-4 = Minimal depression, 5-9 = Mild depression, 10-14 = Moderate depression, 15-19 = Moderately severe depression, 20-27 = Severe depression   Psychosocial Evaluation and Intervention:   Psychosocial Re-Evaluation: Psychosocial Re-Evaluation    [place]  Name 01/31/18 1720             Psychosocial Re-Evaluation   Current issues with  None Identified       Interventions  Encouraged to attend Cardiac Rehabilitation for the exercise       Continue Psychosocial Services   No Follow up required          Psychosocial Discharge (Final Psychosocial Re-Evaluation): Psychosocial Re-Evaluation - 01/31/18 1720      Psychosocial Re-Evaluation   Current issues with  None Identified    Interventions  Encouraged to attend Cardiac Rehabilitation for the exercise    Continue Psychosocial Services   No Follow up required       Vocational Rehabilitation: Provide vocational rehab assistance to qualifying candidates.   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 Participant will state understanding/return demonstration of topics presented.  Learning Barriers/Preferences: Learning Barriers/Preferences - 01/09/18 1518      Learning Barriers/Preferences   Learning Barriers  None    Learning Preferences  None       Education Topics: Hypertension, Hypertension Reduction -Define heart disease and high blood pressure. Discus how high blood pressure affects the body and ways to reduce high blood pressure.   Exercise and Your Heart -Discuss why it is important to exercise, the FITT principles of exercise, normal and abnormal responses to exercise, and how to exercise safely.   Angina -Discuss definition of angina, causes of angina, treatment of angina, and how to decrease risk of having angina.   Cardiac Medications -Review what the following cardiac medications are used for, how they affect the body, and side  effects that may occur when taking the medications.  Medications include Aspirin, Beta blockers, calcium channel blockers, ACE Inhibitors, angiotensin receptor blockers, diuretics, digoxin, and  antihyperlipidemics.   Congestive Heart Failure -Discuss the definition of CHF, how to live with CHF, the signs and symptoms of CHF, and how keep track of weight and sodium intake.   Heart Disease and Intimacy -Discus the effect sexual activity has on the heart, how changes occur during intimacy as we age, and safety during sexual activity.   Smoking Cessation / COPD -Discuss different methods to quit smoking, the health benefits of quitting smoking, and the definition of COPD.   Nutrition I: Fats -Discuss the types of cholesterol, what cholesterol does to the heart, and how cholesterol levels can be controlled.   Nutrition II: Labels -Discuss the different components of food labels and how to read food label   Heart Parts/Heart Disease and PAD -Discuss the anatomy of the heart, the pathway of blood circulation through the heart, and these are affected by heart disease.   Stress I: Signs and Symptoms -Discuss the causes of stress, how stress may lead to anxiety and depression, and ways to limit stress.   Stress II: Relaxation -Discuss different types of relaxation techniques to limit stress.   Warning Signs of Stroke / TIA -Discuss definition of a stroke, what the signs and symptoms are of a stroke, and how to identify when someone is having stroke.   Knowledge Questionnaire Score: Knowledge Questionnaire Score - 01/09/18 1518      Knowledge Questionnaire Score   Pre Score  20/24       Core Components/Risk Factors/Patient Goals at Admission: Personal Goals and Risk Factors at Admission - 01/09/18 1544      Core Components/Risk Factors/Patient Goals on Admission    Weight Management  Yes;Obesity    Intervention  Weight Management: Develop a combined nutrition and exercise  program designed to reach desired caloric intake, while maintaining appropriate intake of nutrient and fiber, sodium and fats, and appropriate energy expenditure required for the weight goal.;Weight Management: Provide education and appropriate resources to help participant work on and attain dietary goals.;Weight Management/Obesity: Establish reasonable short term and long term weight goals.;Obesity: Provide education and appropriate resources to help participant work on and attain dietary goals.    Admit Weight  220 lb 7.4 oz (100 kg)    Goal Weight: Short Term  214 lb (97.1 kg)    Goal Weight: Long Term  208 lb (94.3 kg)    Expected Outcomes  Short Term: Continue to assess and modify interventions until short term weight is achieved;Long Term: Adherence to nutrition and physical activity/exercise program aimed toward attainment of established weight goal;Weight Loss: Understanding of general recommendations for a balanced deficit meal plan, which promotes 1-2 lb weight loss per week and includes a negative energy balance of [phone] kcal/d;Understanding recommendations for meals to include 15-35% energy as protein, 25-35% energy from fat, 35-60% energy from carbohydrates, less than 200mg  of dietary cholesterol, 20-35 gm of total fiber daily;Understanding of distribution of calorie intake throughout the day with the consumption of 4-5 meals/snacks    Diabetes  Yes    Intervention  Provide education about signs/symptoms and action to take for hypo/hyperglycemia.;Provide education about proper nutrition, including hydration, and aerobic/resistive exercise prescription along with prescribed medications to achieve blood glucose in normal ranges: Fasting glucose 65-99 mg/dL    Expected Outcomes  Short Term: Participant verbalizes understanding of the signs/symptoms and immediate care of hyper/hypoglycemia, proper foot care and importance of medication, aerobic/resistive exercise and nutrition plan for blood  glucose control.;Long Term: Attainment of HbA1C < 7%.    Lipids  Yes    Intervention  Provide education and support for participant on nutrition & aerobic/resistive exercise along with prescribed medications to achieve LDL 70mg , HDL >40mg .    Expected Outcomes  Short Term: Participant states understanding of desired cholesterol values and is compliant with medications prescribed. Participant is following exercise prescription and nutrition guidelines.;Long Term: Cholesterol controlled with medications as prescribed, with individualized exercise RX and with personalized nutrition plan. Value goals: LDL < 70mg , HDL > 40 mg.       Core Components/Risk Factors/Patient Goals Review:  Goals and Risk Factor Review    Row Name 01/31/18 1721             Core Components/Risk Factors/Patient Goals Review   Personal Goals Review  Weight Management/Obesity;Lipids;Diabetes       Review  Sam's Vital signs and CBG's have been stable at cardiac rehab.       Expected Outcomes  Sam's will continue to participate in phase 2 cardiac rehab and apply lifestyle modificaitons to his ADL's          Core Components/Risk Factors/Patient Goals at Discharge (Final Review):  Goals and Risk Factor Review - 01/31/18 1721      Core Components/Risk Factors/Patient Goals Review   Personal Goals Review  Weight Management/Obesity;Lipids;Diabetes    Review  Sam's Vital signs and CBG's have been stable at cardiac rehab.    Expected Outcomes  Sam's will continue to participate in phase 2 cardiac rehab and apply lifestyle modificaitons to his ADL's       ITP Comments: ITP Comments    Row Name 01/09/18 1358 01/31/18 1714         ITP Comments  Medical Director- Dr. Fransico Him, MD  30 Day ITP Review. Sal has been enjoying particpating in phase 2 cardiac rehab. Sal particpates one to two days a week due to his work schedule         Comments: See ITP comments.Barnet Pall, RN,BSN 01/31/2018 5:27 PM

## 2018-02-01 LAB — GLUCOSE, CAPILLARY: GLUCOSE-CAPILLARY: 98 mg/dL (ref 70–99)

## 2018-02-02 ENCOUNTER — Encounter (HOSPITAL_COMMUNITY): Payer: PPO

## 2018-02-05 ENCOUNTER — Encounter (HOSPITAL_COMMUNITY): Payer: PPO

## 2018-02-07 ENCOUNTER — Encounter (HOSPITAL_COMMUNITY): Payer: PPO

## 2018-02-09 ENCOUNTER — Encounter (HOSPITAL_COMMUNITY): Payer: PPO

## 2018-02-12 ENCOUNTER — Encounter (HOSPITAL_COMMUNITY): Payer: PPO

## 2018-02-12 ENCOUNTER — Encounter (HOSPITAL_COMMUNITY)
Admission: RE | Admit: 2018-02-12 | Discharge: 2018-02-12 | Disposition: A | Discharge status: Home / Self Care | Payer: PPO | Source: Ambulatory Visit | Attending: Cardiovascular Disease | Admitting: Cardiovascular Disease

## 2018-02-12 DIAGNOSIS — Z955 Presence of coronary angioplasty implant and graft: Secondary | ICD-10-CM

## 2018-02-12 LAB — GLUCOSE, CAPILLARY: Glucose-Capillary: 123 mg/dL — ABNORMAL HIGH (ref 70–99)

## 2018-02-14 ENCOUNTER — Encounter (HOSPITAL_COMMUNITY)
Admission: RE | Admit: 2018-02-14 | Discharge: 2018-02-14 | Disposition: A | Discharge status: Home / Self Care | Payer: PPO | Source: Ambulatory Visit | Attending: Cardiovascular Disease | Admitting: Cardiovascular Disease

## 2018-02-14 ENCOUNTER — Encounter (HOSPITAL_COMMUNITY): Payer: PPO

## 2018-02-14 DIAGNOSIS — Z955 Presence of coronary angioplasty implant and graft: Secondary | ICD-10-CM | POA: Diagnosis not present

## 2018-02-16 ENCOUNTER — Encounter (HOSPITAL_COMMUNITY): Payer: PPO

## 2018-02-19 ENCOUNTER — Encounter (HOSPITAL_COMMUNITY): Payer: PPO

## 2018-02-19 ENCOUNTER — Encounter (HOSPITAL_COMMUNITY)
Admission: RE | Admit: 2018-02-19 | Discharge: 2018-02-19 | Disposition: A | Discharge status: Home / Self Care | Payer: PPO | Source: Ambulatory Visit | Attending: Cardiovascular Disease | Admitting: Cardiovascular Disease

## 2018-02-19 DIAGNOSIS — Z955 Presence of coronary angioplasty implant and graft: Secondary | ICD-10-CM | POA: Diagnosis not present

## 2018-02-21 ENCOUNTER — Encounter (HOSPITAL_COMMUNITY): Payer: PPO

## 2018-02-21 ENCOUNTER — Encounter (HOSPITAL_COMMUNITY)
Admission: RE | Admit: 2018-02-21 | Discharge: 2018-02-21 | Disposition: A | Discharge status: Home / Self Care | Payer: PPO | Source: Ambulatory Visit | Attending: Cardiovascular Disease | Admitting: Cardiovascular Disease

## 2018-02-21 DIAGNOSIS — Z955 Presence of coronary angioplasty implant and graft: Secondary | ICD-10-CM

## 2018-02-23 ENCOUNTER — Encounter (HOSPITAL_COMMUNITY): Payer: PPO

## 2018-02-26 ENCOUNTER — Encounter (HOSPITAL_COMMUNITY): Payer: PPO

## 2018-02-26 ENCOUNTER — Encounter (HOSPITAL_COMMUNITY)
Admission: RE | Admit: 2018-02-26 | Discharge: 2018-02-26 | Disposition: A | Discharge status: Home / Self Care | Payer: PPO | Source: Ambulatory Visit | Attending: Cardiovascular Disease | Admitting: Cardiovascular Disease

## 2018-02-26 DIAGNOSIS — Z955 Presence of coronary angioplasty implant and graft: Secondary | ICD-10-CM

## 2018-02-27 ENCOUNTER — Ambulatory Visit: Payer: PPO | Admitting: Cardiovascular Disease

## 2018-02-28 ENCOUNTER — Encounter (HOSPITAL_COMMUNITY)
Admission: RE | Admit: 2018-02-28 | Discharge: 2018-02-28 | Disposition: A | Discharge status: Home / Self Care | Payer: PPO | Source: Ambulatory Visit | Attending: Cardiovascular Disease | Admitting: Cardiovascular Disease

## 2018-02-28 ENCOUNTER — Encounter (HOSPITAL_COMMUNITY): Payer: PPO

## 2018-02-28 DIAGNOSIS — E7800 Pure hypercholesterolemia, unspecified: Secondary | ICD-10-CM | POA: Insufficient documentation

## 2018-02-28 DIAGNOSIS — E119 Type 2 diabetes mellitus without complications: Secondary | ICD-10-CM | POA: Insufficient documentation

## 2018-02-28 DIAGNOSIS — Z7982 Long term (current) use of aspirin: Secondary | ICD-10-CM | POA: Diagnosis not present

## 2018-02-28 DIAGNOSIS — F419 Anxiety disorder, unspecified: Secondary | ICD-10-CM | POA: Insufficient documentation

## 2018-02-28 DIAGNOSIS — Z955 Presence of coronary angioplasty implant and graft: Secondary | ICD-10-CM

## 2018-02-28 DIAGNOSIS — Z87891 Personal history of nicotine dependence: Secondary | ICD-10-CM | POA: Diagnosis not present

## 2018-02-28 DIAGNOSIS — K219 Gastro-esophageal reflux disease without esophagitis: Secondary | ICD-10-CM | POA: Insufficient documentation

## 2018-02-28 DIAGNOSIS — Z7984 Long term (current) use of oral hypoglycemic drugs: Secondary | ICD-10-CM | POA: Diagnosis not present

## 2018-02-28 DIAGNOSIS — Z7902 Long term (current) use of antithrombotics/antiplatelets: Secondary | ICD-10-CM | POA: Insufficient documentation

## 2018-02-28 DIAGNOSIS — Z79899 Other long term (current) drug therapy: Secondary | ICD-10-CM | POA: Insufficient documentation

## 2018-02-28 DIAGNOSIS — I2510 Atherosclerotic heart disease of native coronary artery without angina pectoris: Secondary | ICD-10-CM | POA: Insufficient documentation

## 2018-03-01 NOTE — Progress Notes (Signed)
Cardiac Individual Treatment Plan  Patient Details  Name: Connor Wright MRN: [id number] Date of Birth: September 25, 1950 Referring Provider:     CARDIAC REHAB PHASE II ORIENTATION from 01/09/2018 in [place]  Referring Provider  Quay Burow, MD      Initial Encounter Date:    CARDIAC REHAB PHASE II ORIENTATION from 01/09/2018 in [place]  Date  01/09/18      Visit Diagnosis: 11/09/2017 Stented coronary artery-CFX  Patient's Home Medications on Admission:  Current Outpatient Medications:  .  ALPRAZolam (XANAX) 0.25 MG tablet, Take 0.25 mg by mouth 3 (three) times daily. , Disp: , Rfl:  .  Artificial Tear Ointment (DRY EYES OP), Place 1 drop into both eyes daily as needed (for dry eyes)., Disp: , Rfl:  .  aspirin 81 MG tablet, Take 81 mg by mouth daily., Disp: , Rfl:  .  atorvastatin (LIPITOR) 80 MG tablet, Take 80 mg by mouth daily., Disp: , Rfl:  .  clopidogrel (PLAVIX) 75 MG tablet, Take 1 tablet (75 mg total) by mouth daily with breakfast., Disp: 90 tablet, Rfl: 3 .  cyclobenzaprine (FLEXERIL) 5 MG tablet, Take 1 tablet (5 mg total) by mouth at bedtime as needed for muscle spasms. (Patient not taking: Reported on 01/04/2018), Disp: 10 tablet, Rfl: 0 .  LUTEIN PO, Take 1 capsule by mouth daily., Disp: , Rfl:  .  metFORMIN (GLUCOPHAGE) 500 MG tablet, Take 1 tablet (500 mg total) by mouth 2 (two) times daily with a meal. Resume in 48 hrs., Disp: , Rfl:  .  metoprolol succinate (TOPROL-XL) 50 MG 24 hr tablet, Take 50 mg by mouth daily., Disp: , Rfl: 5 .  nitroGLYCERIN (NITROSTAT) 0.4 MG SL tablet, Place 0.4 mg under the tongue every 5 (five) minutes as needed for chest pain. , Disp: , Rfl: 5 .  pantoprazole (PROTONIX) 40 MG tablet, Take 1 tablet (40 mg total) by mouth daily., Disp: 90 tablet, Rfl: 3 .  tamsulosin (FLOMAX) 0.4 MG CAPS capsule, Take 0.4 mg by mouth daily., Disp: , Rfl:   Past Medical History: Past  Medical History:  Diagnosis Date  . Anxiety   . Coronary artery disease   . GERD (gastroesophageal reflux disease)   . High cholesterol   . Type II diabetes mellitus (HCC)     Tobacco Use: Social History   Tobacco Use  Smoking Status Former Smoker  . Packs/day: 1.00  . Years: 35.00  . Pack years: 35.00  . Types: Cigarettes  . Last attempt to quit: 2007  . Years since quitting: 12.7  Smokeless Tobacco Never Used    Labs: Recent Review Scientist, physiological    Labs for ITP Cardiac and Pulmonary Rehab Latest Ref Rng & Units 11/10/2017   Cholestrol 0 - 200 mg/dL 138   LDLCALC 0 - 99 mg/dL 58   HDL >40 mg/dL 31(L)   Trlycerides <150 mg/dL 245(H)      Capillary Blood Glucose: Lab Results  Component Value Date   GLUCAP 123 (H) 02/12/2018   GLUCAP 98 01/31/2018   GLUCAP 93 01/24/2018   GLUCAP 166 (H) 01/22/2018   GLUCAP 117 (H) 01/17/2018     Exercise Target Goals: Exercise Program Goal: Individual exercise prescription set using results from initial 6 min walk test and THRR while considering  patient's activity barriers and safety.   Exercise Prescription Goal: Initial exercise prescription builds to 30-45 minutes a day of aerobic activity, 2-3 days per week.  Home exercise guidelines will be given to patient during program as part of exercise prescription that the participant will acknowledge.  Activity Barriers & Risk Stratification: Activity Barriers & Cardiac Risk Stratification - 01/09/18 1404      Activity Barriers & Cardiac Risk Stratification   Activity Barriers  None    Cardiac Risk Stratification  Moderate       6 Minute Walk: 6 Minute Walk    Row Name 01/09/18 1337         6 Minute Walk   Phase  Initial     Distance  1322 feet     Walk Time  6 minutes     # of Rest Breaks  0     MPH  2.5     METS  2.56     RPE  11     VO2 Peak  8.96     Symptoms  No     Resting HR  85 bpm     Resting BP  124/82     Resting Oxygen Saturation   98 %      Exercise Oxygen Saturation  during 6 min walk  97 %     Max Ex. HR  105 bpm     Max Ex. BP  124/86     2 Minute Post BP  100/74        Oxygen Initial Assessment:   Oxygen Re-Evaluation:   Oxygen Discharge (Final Oxygen Re-Evaluation):   Initial Exercise Prescription: Initial Exercise Prescription - 01/09/18 1400      Date of Initial Exercise RX and Referring Provider   Date  01/09/18    Referring Provider  Quay Burow, MD      Bike   Level  0.8    Minutes  10    METs  2.51      NuStep   Level  2    SPM  85    Minutes  10    METs  2.5      Track   Laps  11    Minutes  10    METs  2.92      Prescription Details   Frequency (times per week)  2    Duration  Progress to 30 minutes of continuous aerobic without signs/symptoms of physical distress      Intensity   THRR 40-80% of Max Heartrate  61-122    Ratings of Perceived Exertion  11-13    Perceived Dyspnea  0-4      Progression   Progression  Continue to progress workloads to maintain intensity without signs/symptoms of physical distress.      Resistance Training   Training Prescription  Yes    Weight  2lbs    Reps  10-15       Perform Capillary Blood Glucose checks as needed.  Exercise Prescription Changes: Exercise Prescription Changes    Row Name 01/15/18 1444 01/22/18 1534 02/21/18 1504         Response to Exercise   Blood Pressure (Admit)  122/72  104/70  122/70     Blood Pressure (Exercise)  142/80  150/80  142/72     Blood Pressure (Exit)  134/80  140/80  112/60     Heart Rate (Admit)  87 bpm  99 bpm  94 bpm     Heart Rate (Exercise)  113 bpm  119 bpm  119 bpm     Heart Rate (Exit)  87 bpm  98 bpm  94 bpm     Rating of Perceived Exertion (Exercise)  12  12  12      Symptoms  none  none  none     Duration  Progress to 30 minutes of  aerobic without signs/symptoms of physical distress  Progress to 30 minutes of  aerobic without signs/symptoms of physical distress  Progress to 30 minutes  of  aerobic without signs/symptoms of physical distress     Intensity  THRR unchanged  THRR unchanged  THRR unchanged       Progression   Progression  Continue to progress workloads to maintain intensity without signs/symptoms of physical distress.  Continue to progress workloads to maintain intensity without signs/symptoms of physical distress.  Continue to progress workloads to maintain intensity without signs/symptoms of physical distress.     Average METs  2.4  2.6  3.7 patient didn't report METs from recumbent bike       Resistance Training   Training Prescription  Yes  Yes  No Relaxation day, no weights.     Weight  3lbs  3lbs  -     Reps  10-15  10-15  -     Time  10 Minutes  10 Minutes  -       Interval Training   Interval Training  No  No  No       Bike   Level  -  -  -     Minutes  -  -  -     METs  -  -  -       Recumbant Bike   Level  1  2.5  2.5     Minutes  10  10  10      METs  -  2.5  -       NuStep   Level  2  2  4      SPM  85  85  85     Minutes  10  10  10      METs  2.4  3  3.6       Track   Laps  -  7  16     Minutes  -  10  10     METs  -  2.22  3.79       Home Exercise Plan   Plans to continue exercise at  -  -  Home (comment)     Frequency  -  -  Add 4 additional days to program exercise sessions.     Initial Home Exercises Provided  -  -  01/31/18        Exercise Comments: Exercise Comments    Row Name 01/15/18 1540 01/22/18 1534 01/31/18 1534 02/26/18 1507     Exercise Comments  Patient tolerated low intensity fair. Pt had difficulty with upright, stationary bike, move to recumbent bike and tolerated well.  Reviewed METs and goals with patient.  Reviewed home exercise guidelines with patient.  Reviewed METs and goals with patient.       Exercise Goals and Review: Exercise Goals    Row Name 01/09/18 1405             Exercise Goals   Increase Physical Activity  Yes       Intervention  Provide advice, education, support and counseling  about physical activity/exercise needs.;Develop an individualized exercise prescription for aerobic and resistive training based on initial evaluation findings, risk stratification, comorbidities and participant's personal goals.  Expected Outcomes  Short Term: Attend rehab on a regular basis to increase amount of physical activity.;Long Term: Exercising regularly at least 3-5 days a week.;Long Term: Add in home exercise to make exercise part of routine and to increase amount of physical activity.       Increase Strength and Stamina  Yes       Intervention  Provide advice, education, support and counseling about physical activity/exercise needs.;Develop an individualized exercise prescription for aerobic and resistive training based on initial evaluation findings, risk stratification, comorbidities and participant's personal goals.       Expected Outcomes  Short Term: Increase workloads from initial exercise prescription for resistance, speed, and METs.;Short Term: Perform resistance training exercises routinely during rehab and add in resistance training at home;Long Term: Improve cardiorespiratory fitness, muscular endurance and strength as measured by increased METs and functional capacity (6MWT)       Able to understand and use rate of perceived exertion (RPE) scale  Yes       Intervention  Provide education and explanation on how to use RPE scale       Expected Outcomes  Short Term: Able to use RPE daily in rehab to express subjective intensity level;Long Term:  Able to use RPE to guide intensity level when exercising independently       Knowledge and understanding of Target Heart Rate Range (THRR)  Yes       Intervention  Provide education and explanation of THRR including how the numbers were predicted and where they are located for reference       Expected Outcomes  Short Term: Able to state/look up THRR;Long Term: Able to use THRR to govern intensity when exercising independently;Short Term:  Able to use daily as guideline for intensity in rehab       Able to check pulse independently  Yes       Intervention  Provide education and demonstration on how to check pulse in carotid and radial arteries.;Review the importance of being able to check your own pulse for safety during independent exercise       Expected Outcomes  Short Term: Able to explain why pulse checking is important during independent exercise;Long Term: Able to check pulse independently and accurately       Understanding of Exercise Prescription  Yes       Intervention  Provide education, explanation, and written materials on patient's individual exercise prescription       Expected Outcomes  Short Term: Able to explain program exercise prescription;Long Term: Able to explain home exercise prescription to exercise independently          Exercise Goals Re-Evaluation : Exercise Goals Re-Evaluation    Row Name 01/15/18 1540 01/22/18 1534 01/31/18 1534 02/26/18 1507       Exercise Goal Re-Evaluation   Exercise Goals Review  Able to understand and use rate of perceived exertion (RPE) scale  Increase Physical Activity  Understanding of Exercise Prescription;Knowledge and understanding of Target Heart Rate Range (THRR);Able to understand and use rate of perceived exertion (RPE) scale  Understanding of Exercise Prescription;Knowledge and understanding of Target Heart Rate Range (THRR);Able to understand and use rate of perceived exertion (RPE) scale;Increase Physical Activity    Comments  Patient able to use and understand the RPE scale appropriately.  Patient is doing well with exercise at cardiac rehab. Pt staes that he is walking 20-30 minutes daily.  Reviewed home exercise guidelines with patient including THRR, RPE scale, and endpoitns for exercise. Pt  is walking 30 minutes daily, and swimming occassionally as his mode of home exercise.  Patient is walking 20-30 minutes daily in addition to exercise at cardiac rehab.     Expected Outcomes  Increase workloads as tolerated.  Patient will continue daily walking in addition to exercise at cardiac reahb to help improve strength and stamina.  Increase workloads as tolerated to help achieve health and fitness goals.  Patient will continue daily exercise routine to improve cardiorespiratory fitness.       Discharge Exercise Prescription (Final Exercise Prescription Changes): Exercise Prescription Changes - 02/21/18 1504      Response to Exercise   Blood Pressure (Admit)  122/70    Blood Pressure (Exercise)  142/72    Blood Pressure (Exit)  112/60    Heart Rate (Admit)  94 bpm    Heart Rate (Exercise)  119 bpm    Heart Rate (Exit)  94 bpm    Rating of Perceived Exertion (Exercise)  12    Symptoms  none    Duration  Progress to 30 minutes of  aerobic without signs/symptoms of physical distress    Intensity  THRR unchanged      Progression   Progression  Continue to progress workloads to maintain intensity without signs/symptoms of physical distress.    Average METs  3.7   patient didn't report METs from recumbent bike     Resistance Training   Training Prescription  No   Relaxation day, no weights.   Weight  --    Reps  --    Time  --      Interval Training   Interval Training  No      Recumbant Bike   Level  2.5    Minutes  10    METs  --      NuStep   Level  4    SPM  85    Minutes  10    METs  3.6      Track   Laps  16    Minutes  10    METs  3.79      Home Exercise Plan   Plans to continue exercise at  Home (comment)    Frequency  Add 4 additional days to program exercise sessions.    Initial Home Exercises Provided  01/31/18       Nutrition:  Target Goals: Understanding of nutrition guidelines, daily intake of sodium 1500mg , cholesterol 200mg , calories 30% from fat and 7% or less from saturated fats, daily to have 5 or more servings of fruits and vegetables.  Biometrics: Pre Biometrics - 01/09/18 1337      Pre Biometrics    Height  5\' 5"  (1.651 m)    Weight  100 kg    Waist Circumference  45.5 inches    Hip Circumference  44.5 inches    Waist to Hip Ratio  1.02 %    BMI (Calculated)  36.69    Triceps Skinfold  20 mm    % Body Fat  35.2 %    Grip Strength  33.5 kg    Flexibility  8.5 in    Single Leg Stand  15.22 seconds        Nutrition Therapy Plan and Nutrition Goals: Nutrition Therapy & Goals - 01/10/18 0840      Nutrition Therapy   Diet  consistent carbohydrate heart healthy      Personal Nutrition Goals   Nutrition Goal  Pt to identify and limit  food sources of saturated fat, trans fat, refined carbohydrates and sodium    Personal Goal #2  Pt to identify food quantities necessary to achieve weight loss of 6-24 lbs. at graduation from cardiac rehab.      Intervention Plan   Intervention  Prescribe, educate and counsel regarding individualized specific dietary modifications aiming towards targeted core components such as weight, hypertension, lipid management, diabetes, heart failure and other comorbidities.    Expected Outcomes  Short Term Goal: Understand basic principles of dietary content, such as calories, fat, sodium, cholesterol and nutrients.       Nutrition Assessments: Nutrition Assessments - 01/10/18 0843      MEDFICTS Scores   Pre Score  49       Nutrition Goals Re-Evaluation:   Nutrition Goals Re-Evaluation:   Nutrition Goals Discharge (Final Nutrition Goals Re-Evaluation):   Psychosocial: Target Goals: Acknowledge presence or absence of significant depression and/or stress, maximize coping skills, provide positive support system. Participant is able to verbalize types and ability to use techniques and skills needed for reducing stress and depression.  Initial Review & Psychosocial Screening: Initial Psych Review & Screening - 01/09/18 1517      Initial Review   Current issues with  None Identified      Family Dynamics   Good Support System?  Yes   family,  church, friends    Comments  no psychosocial needs identified, no interventions necessary       Barriers   Psychosocial barriers to participate in program  There are no identifiable barriers or psychosocial needs.      Screening Interventions   Interventions  Encouraged to exercise       Quality of Life Scores: Quality of Life - 01/09/18 1518      Quality of Life   Select  --   did not complete questionnaire     Scores of 19 and below usually indicate a poorer quality of life in these areas.  A difference of  2-3 points is a clinically meaningful difference.  A difference of 2-3 points in the total score of the Quality of Life Index has been associated with significant improvement in overall quality of life, self-image, physical symptoms, and general health in studies assessing change in quality of life.  PHQ-9: Recent Review Flowsheet Data    Depression screen [facility] 2/9 01/15/2018   Decreased Interest 0   Down, Depressed, Hopeless 0   PHQ - 2 Score 0     Interpretation of Total Score  Total Score Depression Severity:  1-4 = Minimal depression, 5-9 = Mild depression, 10-14 = Moderate depression, 15-19 = Moderately severe depression, 20-27 = Severe depression   Psychosocial Evaluation and Intervention:   Psychosocial Re-Evaluation: Psychosocial Re-Evaluation    [place] Name 01/31/18 1720 03/01/18 1449           Psychosocial Re-Evaluation   Current issues with  None Identified  None Identified      Interventions  Encouraged to attend Cardiac Rehabilitation for the exercise  Encouraged to attend Cardiac Rehabilitation for the exercise      Continue Psychosocial Services   No Follow up required  No Follow up required         Psychosocial Discharge (Final Psychosocial Re-Evaluation): Psychosocial Re-Evaluation - 03/01/18 1449      Psychosocial Re-Evaluation   Current issues with  None Identified    Interventions  Encouraged to attend Cardiac Rehabilitation for the exercise     Continue Psychosocial Services  No Follow up required       Vocational Rehabilitation: Provide vocational rehab assistance to qualifying candidates.   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 Participant will state understanding/return demonstration of topics presented.  Learning Barriers/Preferences: Learning Barriers/Preferences - 01/09/18 1518      Learning Barriers/Preferences   Learning Barriers  None    Learning Preferences  None       Education Topics: Count Your Pulse:  -Group instruction provided by verbal instruction, demonstration, patient participation and written materials to support subject.  Instructors address importance of being able to find your pulse and how to count your pulse when at home without a heart monitor.  Patients get hands on experience counting their pulse with staff help and individually.   Heart Attack, Angina, and Risk Factor Modification:  -Group instruction provided by verbal instruction, video, and written materials to support subject.  Instructors address signs and symptoms of angina and heart attacks.    Also discuss risk factors for heart disease and how to make changes to improve heart health risk factors.   CARDIAC REHAB PHASE II EXERCISE from 02/28/2018 in [place]  Date  02/28/18  Instruction Review Code  2- Demonstrated Understanding      Functional Fitness:  -Group instruction provided by verbal instruction, demonstration, patient participation, and written materials to support subject.  Instructors address safety measures for doing things around the house.   Discuss how to get up and down off the floor, how to pick things up properly, how to safely get out of a chair without assistance, and balance training.   Meditation and Mindfulness:  -Group instruction provided by verbal instruction, patient participation, and written materials to support subject.  Instructor addresses importance of mindfulness and meditation practice to help reduce stress and improve awareness.  Instructor also leads participants through a meditation exercise.    CARDIAC REHAB PHASE II EXERCISE from 02/28/2018 in [place]  Date  01/24/18  Educator  Jeanella Craze  Instruction Review Code  2- Demonstrated Understanding      Stretching for Flexibility and Mobility:  -Group instruction provided by verbal instruction, patient participation, and written materials to support subject.  Instructors lead participants through series of stretches that are designed to increase flexibility thus improving mobility.  These stretches are additional exercise for major muscle groups that are typically performed during regular warm up and cool down.   Hands Only CPR:  -Group verbal, video, and participation provides a basic overview of AHA guidelines for community CPR. Role-play of emergencies allow participants the opportunity to practice calling for help and chest compression technique with discussion of AED use.   Hypertension: -Group verbal and written instruction that provides a basic overview of hypertension including the most recent diagnostic guidelines, risk factor reduction with self-care instructions and medication management.    Nutrition I class: Heart Healthy Eating:  -Group instruction provided by PowerPoint slides, verbal discussion, and written materials to support subject matter. The instructor gives an explanation and review of the Therapeutic Lifestyle Changes diet recommendations, which includes a discussion on lipid goals, dietary fat,  sodium, fiber,  plant stanol/sterol esters, sugar, and the components of a well-balanced, healthy diet.   Nutrition II class: Lifestyle Skills:  -Group instruction provided by PowerPoint slides, verbal discussion, and written materials to support subject matter. The instructor gives an explanation and review of label reading, grocery shopping for heart health, heart healthy recipe modifications, and ways to make healthier choices when eating out.   Diabetes Question & Answer:  -Group instruction provided by PowerPoint slides, verbal discussion, and written materials to support subject matter. The instructor gives an explanation and review of diabetes co-morbidities, pre- and post-prandial blood glucose goals, pre-exercise blood glucose goals, signs, symptoms, and treatment of hypoglycemia and hyperglycemia, and foot care basics.   Diabetes Blitz:  -Group instruction provided by PowerPoint slides, verbal discussion, and written materials to support subject matter. The instructor gives an explanation and review of the physiology behind type 1 and type 2 diabetes, diabetes medications and rational behind using different medications, pre- and post-prandial blood glucose recommendations and Hemoglobin A1c goals, diabetes diet, and exercise including blood glucose guidelines for exercising safely.    Portion Distortion:  -Group instruction provided by PowerPoint slides, verbal discussion, written materials, and food models to support subject matter. The instructor gives an explanation of serving size versus portion size, changes in portions sizes over the last 20 years, and what consists of a serving from each food group.   Stress Management:  -Group instruction provided by verbal instruction, video, and written materials to support subject matter.  Instructors review role of stress in heart disease and how to cope with stress positively.     CARDIAC REHAB PHASE II EXERCISE from 02/28/2018 in [place]  Date  02/14/18  Instruction Review Code  2- Demonstrated Understanding      Exercising on Your Own:  -Group instruction provided by verbal instruction, power point, and written materials to support subject.  Instructors discuss benefits of exercise, components of exercise, frequency and intensity of exercise, and end points for exercise.  Also discuss use of nitroglycerin and activating EMS.  Review options of places to exercise outside of rehab.  Review guidelines for sex with heart disease.   CARDIAC REHAB PHASE II EXERCISE from 02/28/2018 in [place]  Date  01/17/18  Educator  EP  Instruction Review Code  2- Demonstrated Understanding      Cardiac Drugs I:  -Group instruction provided by verbal instruction and written materials to support subject.  Instructor reviews cardiac drug classes: antiplatelets, anticoagulants, beta blockers, and statins.  Instructor discusses reasons, side effects, and lifestyle considerations for each drug class.   Cardiac Drugs II:  -Group instruction provided by verbal instruction and written materials to support subject.  Instructor reviews cardiac drug classes: angiotensin converting enzyme inhibitors (ACE-I), angiotensin II receptor blockers (ARBs), nitrates, and calcium channel blockers.  Instructor discusses reasons, side effects, and lifestyle considerations for each drug class.   Anatomy and Physiology of the Circulatory System:  Group verbal and written instruction and models provide basic cardiac anatomy and physiology, with the coronary electrical and arterial systems. Review of: AMI, Angina, Valve disease, Heart Failure, Peripheral Artery Disease, Cardiac Arrhythmia, Pacemakers, and the ICD.   CARDIAC REHAB PHASE II EXERCISE from 02/28/2018 in [facility]  Date  02/21/18  Educator  RN  Instruction Review Code  2- Demonstrated Understanding       Other Education:  -Group or individual verbal, written, or video instructions that support  the educational goals of the cardiac rehab program.   Holiday Eating Survival Tips:  -Group instruction provided by PowerPoint slides, verbal discussion, and written materials to support subject matter. The instructor gives patients tips, tricks, and techniques to help them not only survive but enjoy the holidays despite the onslaught of food that accompanies the holidays.   Knowledge Questionnaire Score: Knowledge Questionnaire Score - 01/09/18 1518      Knowledge Questionnaire Score   Pre Score  20/24       Core Components/Risk Factors/Patient Goals at Admission: Personal Goals and Risk Factors at Admission - 01/09/18 1544      Core Components/Risk Factors/Patient Goals on Admission    Weight Management  Yes;Obesity    Intervention  Weight Management: Develop a combined nutrition and exercise program designed to reach desired caloric intake, while maintaining appropriate intake of nutrient and fiber, sodium and fats, and appropriate energy expenditure required for the weight goal.;Weight Management: Provide education and appropriate resources to help participant work on and attain dietary goals.;Weight Management/Obesity: Establish reasonable short term and long term weight goals.;Obesity: Provide education and appropriate resources to help participant work on and attain dietary goals.    Admit Weight  220 lb 7.4 oz (100 kg)    Goal Weight: Short Term  214 lb (97.1 kg)    Goal Weight: Long Term  208 lb (94.3 kg)    Expected Outcomes  Short Term: Continue to assess and modify interventions until short term weight is achieved;Long Term: Adherence to nutrition and physical activity/exercise program aimed toward attainment of established weight goal;Weight Loss: Understanding of general recommendations for a balanced deficit meal plan, which promotes 1-2 lb weight loss per week and includes a  negative energy balance of [id number] kcal/d;Understanding recommendations for meals to include 15-35% energy as protein, 25-35% energy from fat, 35-60% energy from carbohydrates, less than 200mg  of dietary cholesterol, 20-35 gm of total fiber daily;Understanding of distribution of calorie intake throughout the day with the consumption of 4-5 meals/snacks    Diabetes  Yes    Intervention  Provide education about signs/symptoms and action to take for hypo/hyperglycemia.;Provide education about proper nutrition, including hydration, and aerobic/resistive exercise prescription along with prescribed medications to achieve blood glucose in normal ranges: Fasting glucose 65-99 mg/dL    Expected Outcomes  Short Term: Participant verbalizes understanding of the signs/symptoms and immediate care of hyper/hypoglycemia, proper foot care and importance of medication, aerobic/resistive exercise and nutrition plan for blood glucose control.;Long Term: Attainment of HbA1C < 7%.    Lipids  Yes    Intervention  Provide education and support for participant on nutrition & aerobic/resistive exercise along with prescribed medications to achieve LDL 70mg , HDL >40mg .    Expected Outcomes  Short Term: Participant states understanding of desired cholesterol values and is compliant with medications prescribed. Participant is following exercise prescription and nutrition guidelines.;Long Term: Cholesterol controlled with medications as prescribed, with individualized exercise RX and with personalized nutrition plan. Value goals: LDL < 70mg , HDL > 40 mg.       Core Components/Risk Factors/Patient Goals Review:  Goals and Risk Factor Review    Row Name 01/31/18 1721 03/01/18 1449           Core Components/Risk Factors/Patient Goals Review   Personal Goals Review  Weight Management/Obesity;Lipids;Diabetes  Weight Management/Obesity;Lipids;Diabetes      Review  Connor Wright's Vital signs and CBG's have been stable at cardiac rehab.   Connor Wright's Vital signs and CBG's have been stable at cardiac rehab.  Expected Outcomes  Connor Wright's will continue to participate in phase 2 cardiac rehab and apply lifestyle modificaitons to his ADL's  Connor Wright's will continue to participate in phase 2 cardiac rehab and apply lifestyle modificaitons to his ADL's         Core Components/Risk Factors/Patient Goals at Discharge (Final Review):  Goals and Risk Factor Review - 03/01/18 1449      Core Components/Risk Factors/Patient Goals Review   Personal Goals Review  Weight Management/Obesity;Lipids;Diabetes    Review  Connor Wright's Vital signs and CBG's have been stable at cardiac rehab.    Expected Outcomes  Connor Wright's will continue to participate in phase 2 cardiac rehab and apply lifestyle modificaitons to his ADL's       ITP Comments: ITP Comments    Row Name 01/09/18 1358 01/31/18 1714 03/01/18 1448       ITP Comments  Medical Director- Dr. Fransico Him, MD  30 Day ITP Review. Connor Wright has been enjoying particpating in phase 2 cardiac rehab. Connor Wright particpates one to two days a week due to his work schedule  30 Day ITP Review. Connor Wright has been enjoying particpating in phase 2 cardiac rehab. Connor Wright particpates one to two days a week due to his work schedule        Comments: See ITP comments.Barnet Pall, RN,BSN 03/01/2018 2:51 PM

## 2018-03-02 ENCOUNTER — Encounter (HOSPITAL_COMMUNITY): Payer: PPO

## 2018-03-05 ENCOUNTER — Encounter (HOSPITAL_COMMUNITY)
Admission: RE | Admit: 2018-03-05 | Discharge: 2018-03-05 | Disposition: A | Discharge status: Home / Self Care | Payer: PPO | Source: Ambulatory Visit | Attending: Cardiovascular Disease | Admitting: Cardiovascular Disease

## 2018-03-05 ENCOUNTER — Encounter (HOSPITAL_COMMUNITY): Payer: PPO

## 2018-03-05 DIAGNOSIS — Z955 Presence of coronary angioplasty implant and graft: Secondary | ICD-10-CM

## 2018-03-07 ENCOUNTER — Encounter (HOSPITAL_COMMUNITY)
Admission: RE | Admit: 2018-03-07 | Discharge: 2018-03-07 | Disposition: A | Discharge status: Home / Self Care | Payer: PPO | Source: Ambulatory Visit | Attending: Cardiovascular Disease | Admitting: Cardiovascular Disease

## 2018-03-07 ENCOUNTER — Encounter (HOSPITAL_COMMUNITY): Payer: PPO

## 2018-03-07 DIAGNOSIS — Z955 Presence of coronary angioplasty implant and graft: Secondary | ICD-10-CM | POA: Diagnosis not present

## 2018-03-09 ENCOUNTER — Encounter (HOSPITAL_COMMUNITY): Payer: PPO

## 2018-03-12 ENCOUNTER — Encounter (HOSPITAL_COMMUNITY)
Admission: RE | Admit: 2018-03-12 | Discharge: 2018-03-12 | Disposition: A | Discharge status: Home / Self Care | Payer: PPO | Source: Ambulatory Visit | Attending: Cardiovascular Disease | Admitting: Cardiovascular Disease

## 2018-03-12 ENCOUNTER — Encounter (HOSPITAL_COMMUNITY): Payer: PPO

## 2018-03-12 DIAGNOSIS — Z955 Presence of coronary angioplasty implant and graft: Secondary | ICD-10-CM

## 2018-03-14 ENCOUNTER — Encounter (HOSPITAL_COMMUNITY)
Admission: RE | Admit: 2018-03-14 | Discharge: 2018-03-14 | Disposition: A | Discharge status: Home / Self Care | Payer: PPO | Source: Ambulatory Visit | Attending: Cardiovascular Disease | Admitting: Cardiovascular Disease

## 2018-03-14 ENCOUNTER — Encounter (HOSPITAL_COMMUNITY): Payer: PPO

## 2018-03-14 DIAGNOSIS — Z955 Presence of coronary angioplasty implant and graft: Secondary | ICD-10-CM

## 2018-03-16 ENCOUNTER — Encounter (HOSPITAL_COMMUNITY): Payer: PPO

## 2018-03-19 ENCOUNTER — Encounter (HOSPITAL_COMMUNITY): Payer: PPO

## 2018-03-19 ENCOUNTER — Encounter (HOSPITAL_COMMUNITY)
Admission: RE | Admit: 2018-03-19 | Discharge: 2018-03-19 | Disposition: A | Discharge status: Home / Self Care | Payer: PPO | Source: Ambulatory Visit | Attending: Cardiovascular Disease | Admitting: Cardiovascular Disease

## 2018-03-19 DIAGNOSIS — Z955 Presence of coronary angioplasty implant and graft: Secondary | ICD-10-CM | POA: Diagnosis not present

## 2018-03-21 ENCOUNTER — Encounter (HOSPITAL_COMMUNITY)
Admission: RE | Admit: 2018-03-21 | Discharge: 2018-03-21 | Disposition: A | Discharge status: Home / Self Care | Payer: PPO | Source: Ambulatory Visit | Attending: Cardiovascular Disease | Admitting: Cardiovascular Disease

## 2018-03-21 ENCOUNTER — Encounter (HOSPITAL_COMMUNITY): Payer: PPO

## 2018-03-21 DIAGNOSIS — Z955 Presence of coronary angioplasty implant and graft: Secondary | ICD-10-CM | POA: Diagnosis not present

## 2018-03-23 ENCOUNTER — Encounter (HOSPITAL_COMMUNITY): Payer: PPO

## 2018-03-26 ENCOUNTER — Encounter (HOSPITAL_COMMUNITY): Payer: PPO

## 2018-03-26 ENCOUNTER — Encounter (HOSPITAL_COMMUNITY)
Admission: RE | Admit: 2018-03-26 | Discharge: 2018-03-26 | Disposition: A | Discharge status: Home / Self Care | Payer: PPO | Source: Ambulatory Visit | Attending: Cardiovascular Disease | Admitting: Cardiovascular Disease

## 2018-03-26 DIAGNOSIS — Z955 Presence of coronary angioplasty implant and graft: Secondary | ICD-10-CM

## 2018-03-27 ENCOUNTER — Ambulatory Visit: Payer: PPO | Admitting: Cardiovascular Disease

## 2018-03-27 ENCOUNTER — Encounter: Payer: Self-pay | Admitting: Cardiovascular Disease

## 2018-03-27 DIAGNOSIS — Z9861 Coronary angioplasty status: Secondary | ICD-10-CM

## 2018-03-27 DIAGNOSIS — I2510 Atherosclerotic heart disease of native coronary artery without angina pectoris: Secondary | ICD-10-CM

## 2018-03-27 DIAGNOSIS — I10 Essential (primary) hypertension: Secondary | ICD-10-CM

## 2018-03-27 DIAGNOSIS — E785 Hyperlipidemia, unspecified: Secondary | ICD-10-CM | POA: Diagnosis not present

## 2018-03-27 NOTE — Assessment & Plan Note (Signed)
History of CAD status post circumflex intervention by myself 11/09/2017 because of typical effort angina.  I stented this with a 3.5 x 16 mm long Synergy drug-eluting stent postdilated to 3.9 mm .  His symptoms have resolved.  He remains on dual antiplatelet therapy including aspirin and clopidogrel.

## 2018-03-27 NOTE — Patient Instructions (Signed)

## 2018-03-27 NOTE — Assessment & Plan Note (Signed)
History of dyslipidemia on statin therapy with recent lipid profile performed 11/10/1988 revealing total cholesterol 138, LDL 58 and HDL of 31.

## 2018-03-27 NOTE — Progress Notes (Signed)
03/27/2018 Cinch Ormond   08-Jun-1950  [id number]  Primary Physician Levin Erp, MD Primary Cardiologist: Lorretta Harp MD [name], [place]  HPI:  Connor Wright is a 67 y.o.   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  He was referred by Dr. Nyoka Cowden for cardiovascular evaluation because of new onset dyspnea and chest pressure.  I last saw him in the office 11/01/2017. Risk factors include 40 pack years of tobacco abuse having quit 12 years ago, treated diabetes and hyperlipidemia as well as family history of heart disease with 2 brothers who had stents and bypass surgery.  He is never had a heart attack or stroke.  He is noticed increasing dyspnea on exertion and chest pressure with walking up a hill in the last several weeks which sounds fairly typical for angina. Because of his fairly typical symptoms I elected to proceed directly to outpatient radial diagnostic coronary angiography on 11/09/2017 which revealed a high-grade mid AV groove circumflex stenosis which I stented with a 3.5 x 16 mm long Synergy drug-eluting stent postdilated to 3.9 mm.  His symptoms resolved.  He is been pain-free since on aspirin and clopidogrel.  Current Meds  Medication Sig  . ALPRAZolam (XANAX) 0.25 MG tablet Take 0.25 mg by mouth 3 (three) times daily.   . Artificial Tear Ointment (DRY EYES OP) Place 1 drop into both eyes daily as needed (for dry eyes).  Marland Kitchen aspirin 81 MG tablet Take 81 mg by mouth daily.  Marland Kitchen atorvastatin (LIPITOR) 80 MG tablet Take 80 mg by mouth daily.  . clopidogrel (PLAVIX) 75 MG tablet Take 1 tablet (75 mg total) by mouth daily with breakfast.  . cyclobenzaprine (FLEXERIL) 5 MG tablet Take 1 tablet (5 mg total) by mouth at bedtime as needed for muscle spasms.  . LUTEIN PO Take 1 capsule by mouth daily.  . metFORMIN (GLUCOPHAGE) 500 MG tablet Take 1 tablet (500 mg total) by mouth 2 (two) times daily with a meal.  Resume in 48 hrs.  . metoprolol succinate (TOPROL-XL) 50 MG 24 hr tablet Take 50 mg by mouth daily.  . nitroGLYCERIN (NITROSTAT) 0.4 MG SL tablet Place 0.4 mg under the tongue every 5 (five) minutes as needed for chest pain.   . pantoprazole (PROTONIX) 40 MG tablet Take 1 tablet (40 mg total) by mouth daily.  . tamsulosin (FLOMAX) 0.4 MG CAPS capsule Take 0.4 mg by mouth daily.     No Known Allergies  Social History   Socioeconomic History  . Marital status: Married    Spouse name: Not on file  . Number of children: Not on file  . Years of education: Not on file  . Highest education level: Not on file  Occupational History  . Not on file  Social Needs  . Financial resource strain: Not on file  . Food insecurity:    Worry: Not on file    Inability: Not on file  . Transportation needs:    Medical: Not on file    Non-medical: Not on file  Tobacco Use  . Smoking status: Former Smoker    Packs/day: 1.00    Years: 35.00    Pack years: 35.00    Types: Cigarettes    Last attempt to quit: 2007    Years since quitting: 12.8  . Smokeless tobacco: Never Used  Substance and Sexual Activity  . Alcohol use: Not Currently    Comment: 11/09/2017 "nothing in the  last 35 years"  . Drug use: Never  . Sexual activity: Yes  Lifestyle  . Physical activity:    Days per week: Not on file    Minutes per session: Not on file  . Stress: Not on file  Relationships  . Social connections:    Talks on phone: Not on file    Gets together: Not on file    Attends religious service: Not on file    Active member of club or organization: Not on file    Attends meetings of clubs or organizations: Not on file    Relationship status: Not on file  . Intimate partner violence:    Fear of current or ex partner: Not on file    Emotionally abused: Not on file    Physically abused: Not on file    Forced sexual activity: Not on file  Other Topics Concern  . Not on file  Social History Narrative  . Not  on file     Review of Systems: General: negative for chills, fever, night sweats or weight changes.  Cardiovascular: negative for chest pain, dyspnea on exertion, edema, orthopnea, palpitations, paroxysmal nocturnal dyspnea or shortness of breath Dermatological: negative for rash Respiratory: negative for cough or wheezing Urologic: negative for hematuria Abdominal: negative for nausea, vomiting, diarrhea, bright red blood per rectum, melena, or hematemesis Neurologic: negative for visual changes, syncope, or dizziness All other systems reviewed and are otherwise negative except as noted above.    Blood pressure 122/80, pulse 93, height 5' 6.5" (1.689 m), weight 218 lb (98.9 kg).  General appearance: alert and no distress Neck: no adenopathy, no carotid bruit, no JVD, supple, symmetrical, trachea midline and thyroid not enlarged, symmetric, no tenderness/mass/nodules Lungs: clear to auscultation bilaterally Heart: regular rate and rhythm, S1, S2 normal, no murmur, click, rub or gallop Extremities: extremities normal, atraumatic, no cyanosis or edema Pulses: 2+ and symmetric Skin: Skin color, texture, turgor normal. No rashes or lesions Neurologic: Alert and oriented X 3, normal strength and tone. Normal symmetric reflexes. Normal coordination and gait  EKG not performed today  ASSESSMENT AND PLAN:   Essential hypertension History of essential hypertension her blood pressure measured today 122/80.  He is on metoprolol.  Continue current meds at current dosing.  Dyslipidemia History of dyslipidemia on statin therapy with recent lipid profile performed 11/10/1988 revealing total cholesterol 138, LDL 58 and HDL of 31.  CAD S/P percutaneous coronary angioplasty History of CAD status post circumflex intervention by myself 11/09/2017 because of typical effort angina.  I stented this with a 3.5 x 16 mm long Synergy drug-eluting stent postdilated to 3.9 mm .  His symptoms have resolved.  He  remains on dual antiplatelet therapy including aspirin and clopidogrel.      Lorretta Harp MD FACP,FACC,FAHA, [facility] 03/27/2018 2:36 PM

## 2018-03-27 NOTE — Assessment & Plan Note (Signed)
History of essential hypertension her blood pressure measured today 122/80.  He is on metoprolol.  Continue current meds at current dosing.

## 2018-03-28 ENCOUNTER — Encounter (HOSPITAL_COMMUNITY)
Admission: RE | Admit: 2018-03-28 | Discharge: 2018-03-28 | Disposition: A | Discharge status: Home / Self Care | Payer: PPO | Source: Ambulatory Visit | Attending: Cardiovascular Disease | Admitting: Cardiovascular Disease

## 2018-03-28 ENCOUNTER — Encounter (HOSPITAL_COMMUNITY): Payer: PPO

## 2018-03-28 DIAGNOSIS — Z955 Presence of coronary angioplasty implant and graft: Secondary | ICD-10-CM | POA: Diagnosis not present

## 2018-03-29 NOTE — Progress Notes (Signed)
Cardiac Individual Treatment Plan  Patient Details  Name: Connor Wright MRN: [id number] Date of Birth: [birth year] Referring Provider:     CARDIAC REHAB PHASE II ORIENTATION from 01/09/2018 in [place]  Referring Provider  Quay Burow, MD      Initial Encounter Date:    CARDIAC REHAB PHASE II ORIENTATION from 01/09/2018 in [place]  Date  01/09/18      Visit Diagnosis: 11/09/2017 Stented coronary artery-CFX  Patient's Home Medications on Admission:  Current Outpatient Medications:  .  ALPRAZolam (XANAX) 0.25 MG tablet, Take 0.25 mg by mouth 3 (three) times daily. , Disp: , Rfl:  .  Artificial Tear Ointment (DRY EYES OP), Place 1 drop into both eyes daily as needed (for dry eyes)., Disp: , Rfl:  .  aspirin 81 MG tablet, Take 81 mg by mouth daily., Disp: , Rfl:  .  atorvastatin (LIPITOR) 80 MG tablet, Take 80 mg by mouth daily., Disp: , Rfl:  .  clopidogrel (PLAVIX) 75 MG tablet, Take 1 tablet (75 mg total) by mouth daily with breakfast., Disp: 90 tablet, Rfl: 3 .  cyclobenzaprine (FLEXERIL) 5 MG tablet, Take 1 tablet (5 mg total) by mouth at bedtime as needed for muscle spasms., Disp: 10 tablet, Rfl: 0 .  LUTEIN PO, Take 1 capsule by mouth daily., Disp: , Rfl:  .  metFORMIN (GLUCOPHAGE) 500 MG tablet, Take 1 tablet (500 mg total) by mouth 2 (two) times daily with a meal. Resume in 48 hrs., Disp: , Rfl:  .  metoprolol succinate (TOPROL-XL) 50 MG 24 hr tablet, Take 50 mg by mouth daily., Disp: , Rfl: 5 .  nitroGLYCERIN (NITROSTAT) 0.4 MG SL tablet, Place 0.4 mg under the tongue every 5 (five) minutes as needed for chest pain. , Disp: , Rfl: 5 .  pantoprazole (PROTONIX) 40 MG tablet, Take 1 tablet (40 mg total) by mouth daily., Disp: 90 tablet, Rfl: 3 .  tamsulosin (FLOMAX) 0.4 MG CAPS capsule, Take 0.4 mg by mouth daily., Disp: , Rfl:   Past Medical History: Past Medical History:  Diagnosis Date  . Anxiety    . Coronary artery disease   . GERD (gastroesophageal reflux disease)   . High cholesterol   . Type II diabetes mellitus (HCC)     Tobacco Use: Social History   Tobacco Use  Smoking Status Former Smoker  . Packs/day: 1.00  . Years: 35.00  . Pack years: 35.00  . Types: Cigarettes  . Last attempt to quit: 2007  . Years since quitting: 12.8  Smokeless Tobacco Never Used    Labs: Recent Review Scientist, physiological    Labs for ITP Cardiac and Pulmonary Rehab Latest Ref Rng & Units 11/10/2017   Cholestrol 0 - 200 mg/dL 138   LDLCALC 0 - 99 mg/dL 58   HDL >40 mg/dL 31(L)   Trlycerides <150 mg/dL 245(H)      Capillary Blood Glucose: Lab Results  Component Value Date   GLUCAP 123 (H) 02/12/2018   GLUCAP 98 01/31/2018   GLUCAP 93 01/24/2018   GLUCAP 166 (H) 01/22/2018   GLUCAP 117 (H) 01/17/2018     Exercise Target Goals: Exercise Program Goal: Individual exercise prescription set using results from initial 6 min walk test and THRR while considering  patient's activity barriers and safety.   Exercise Prescription Goal: Initial exercise prescription builds to 30-45 minutes a day of aerobic activity, 2-3 days per week.  Home exercise guidelines will be given  to patient during program as part of exercise prescription that the participant will acknowledge.  Activity Barriers & Risk Stratification: Activity Barriers & Cardiac Risk Stratification - 01/09/18 1404      Activity Barriers & Cardiac Risk Stratification   Activity Barriers  None    Cardiac Risk Stratification  Moderate       6 Minute Walk: 6 Minute Walk    Row Name 01/09/18 1337         6 Minute Walk   Phase  Initial     Distance  1322 feet     Walk Time  6 minutes     # of Rest Breaks  0     MPH  2.5     METS  2.56     RPE  11     VO2 Peak  8.96     Symptoms  No     Resting HR  85 bpm     Resting BP  124/82     Resting Oxygen Saturation   98 %     Exercise Oxygen Saturation  during 6 min walk  97 %      Max Ex. HR  105 bpm     Max Ex. BP  124/86     2 Minute Post BP  100/74        Oxygen Initial Assessment:   Oxygen Re-Evaluation:   Oxygen Discharge (Final Oxygen Re-Evaluation):   Initial Exercise Prescription: Initial Exercise Prescription - 01/09/18 1400      Date of Initial Exercise RX and Referring Provider   Date  01/09/18    Referring Provider  Quay Burow, MD      Bike   Level  0.8    Minutes  10    METs  2.51      NuStep   Level  2    SPM  85    Minutes  10    METs  2.5      Track   Laps  11    Minutes  10    METs  2.92      Prescription Details   Frequency (times per week)  2    Duration  Progress to 30 minutes of continuous aerobic without signs/symptoms of physical distress      Intensity   THRR 40-80% of Max Heartrate  61-122    Ratings of Perceived Exertion  11-13    Perceived Dyspnea  0-4      Progression   Progression  Continue to progress workloads to maintain intensity without signs/symptoms of physical distress.      Resistance Training   Training Prescription  Yes    Weight  2lbs    Reps  10-15       Perform Capillary Blood Glucose checks as needed.  Exercise Prescription Changes: Exercise Prescription Changes    Row Name 01/15/18 1444 01/22/18 1534 02/21/18 1504 03/05/18 1452 03/19/18 1451     Response to Exercise   Blood Pressure (Admit)  122/72  104/70  122/70  130/78  136/62   Blood Pressure (Exercise)  142/80  150/80  142/72  124/72  168/78   Blood Pressure (Exit)  134/80  140/80  112/60  130/78  128/80   Heart Rate (Admit)  87 bpm  99 bpm  94 bpm  84 bpm  93 bpm   Heart Rate (Exercise)  113 bpm  119 bpm  119 bpm  123 bpm  132 bpm   Heart Rate (  Exit)  87 bpm  98 bpm  94 bpm  92 bpm  93 bpm   Rating of Perceived Exertion (Exercise)  12  12  12  11  12    Symptoms  none  none  none  none  none   Duration  Progress to 30 minutes of  aerobic without signs/symptoms of physical distress  Progress to 30 minutes of   aerobic without signs/symptoms of physical distress  Progress to 30 minutes of  aerobic without signs/symptoms of physical distress  Progress to 30 minutes of  aerobic without signs/symptoms of physical distress  Progress to 30 minutes of  aerobic without signs/symptoms of physical distress   Intensity  THRR unchanged  THRR unchanged  THRR unchanged  THRR unchanged  THRR unchanged     Progression   Progression  Continue to progress workloads to maintain intensity without signs/symptoms of physical distress.  Continue to progress workloads to maintain intensity without signs/symptoms of physical distress.  Continue to progress workloads to maintain intensity without signs/symptoms of physical distress.  Continue to progress workloads to maintain intensity without signs/symptoms of physical distress.  Continue to progress workloads to maintain intensity without signs/symptoms of physical distress.   Average METs  2.4  2.6  3.7 patient didn't report METs from recumbent bike  3.4  4.4     Resistance Training   Training Prescription  Yes  Yes  No Relaxation day, no weights.  Yes  Yes   Weight  3lbs  3lbs  -  4lbs  5lbs   Reps  10-15  10-15  -  10-15  10-15   Time  10 Minutes  10 Minutes  -  10 Minutes  10 Minutes     Interval Training   Interval Training  No  No  No  No  No     Bike   Level  -  -  -  -  -   Minutes  -  -  -  -  -   METs  -  -  -  -  -     Recumbant Bike   Level  1  2.5  2.5  2.5  4   Minutes  10  10  10  10  10    METs  -  2.5  -  2.6  4.2     NuStep   Level  2  2  4  4  4    SPM  85  85  85  85  95   Minutes  10  10  10  10  10    METs  2.4  3  3.6  3.9  5.1     Track   Laps  -  7  16  16  16    Minutes  -  10  10  10  10    METs  -  2.22  3.79  3.79  3.78     Home Exercise Plan   Plans to continue exercise at  -  -  Home (comment)  Home (comment)  Home (comment)   Frequency  -  -  Add 4 additional days to program exercise sessions.  Add 4 additional days to program  exercise sessions.  Add 4 additional days to program exercise sessions.   Initial Home Exercises Provided  -  -  01/31/18  01/31/18  01/31/18      Exercise Comments: Exercise Comments    Row Name 01/15/18 1540 01/22/18 1534  01/31/18 1534 02/26/18 1507 03/07/18 1515   Exercise Comments  Patient tolerated low intensity fair. Pt had difficulty with upright, stationary bike, move to recumbent bike and tolerated well.  Reviewed METs and goals with patient.  Reviewed home exercise guidelines with patient.  Reviewed METs and goals with patient.  Reviewed METs with patient.   [place] Name 03/21/18 1532           Exercise Comments  Reviewed METs and goals with patient.          Exercise Goals and Review: Exercise Goals    Row Name 01/09/18 1405             Exercise Goals   Increase Physical Activity  Yes       Intervention  Provide advice, education, support and counseling about physical activity/exercise needs.;Develop an individualized exercise prescription for aerobic and resistive training based on initial evaluation findings, risk stratification, comorbidities and participant's personal goals.       Expected Outcomes  Short Term: Attend rehab on a regular basis to increase amount of physical activity.;Long Term: Exercising regularly at least 3-5 days a week.;Long Term: Add in home exercise to make exercise part of routine and to increase amount of physical activity.       Increase Strength and Stamina  Yes       Intervention  Provide advice, education, support and counseling about physical activity/exercise needs.;Develop an individualized exercise prescription for aerobic and resistive training based on initial evaluation findings, risk stratification, comorbidities and participant's personal goals.       Expected Outcomes  Short Term: Increase workloads from initial exercise prescription for resistance, speed, and METs.;Short Term: Perform resistance training exercises routinely during  rehab and add in resistance training at home;Long Term: Improve cardiorespiratory fitness, muscular endurance and strength as measured by increased METs and functional capacity (6MWT)       Able to understand and use rate of perceived exertion (RPE) scale  Yes       Intervention  Provide education and explanation on how to use RPE scale       Expected Outcomes  Short Term: Able to use RPE daily in rehab to express subjective intensity level;Long Term:  Able to use RPE to guide intensity level when exercising independently       Knowledge and understanding of Target Heart Rate Range (THRR)  Yes       Intervention  Provide education and explanation of THRR including how the numbers were predicted and where they are located for reference       Expected Outcomes  Short Term: Able to state/look up THRR;Long Term: Able to use THRR to govern intensity when exercising independently;Short Term: Able to use daily as guideline for intensity in rehab       Able to check pulse independently  Yes       Intervention  Provide education and demonstration on how to check pulse in carotid and radial arteries.;Review the importance of being able to check your own pulse for safety during independent exercise       Expected Outcomes  Short Term: Able to explain why pulse checking is important during independent exercise;Long Term: Able to check pulse independently and accurately       Understanding of Exercise Prescription  Yes       Intervention  Provide education, explanation, and written materials on patient's individual exercise prescription       Expected Outcomes  Short Term: Able to explain program  exercise prescription;Long Term: Able to explain home exercise prescription to exercise independently          Exercise Goals Re-Evaluation : Exercise Goals Re-Evaluation    Row Name 01/15/18 1540 01/22/18 1534 01/31/18 1534 02/26/18 1507 03/21/18 1532     Exercise Goal Re-Evaluation   Exercise Goals Review  Able to  understand and use rate of perceived exertion (RPE) scale  Increase Physical Activity  Understanding of Exercise Prescription;Knowledge and understanding of Target Heart Rate Range (THRR);Able to understand and use rate of perceived exertion (RPE) scale  Understanding of Exercise Prescription;Knowledge and understanding of Target Heart Rate Range (THRR);Able to understand and use rate of perceived exertion (RPE) scale;Increase Physical Activity  Understanding of Exercise Prescription;Knowledge and understanding of Target Heart Rate Range (THRR);Able to understand and use rate of perceived exertion (RPE) scale;Increase Physical Activity;Increase Strength and Stamina   Comments  Patient able to use and understand the RPE scale appropriately.  Patient is doing well with exercise at cardiac rehab. Pt staes that he is walking 20-30 minutes daily.  Reviewed home exercise guidelines with patient including THRR, RPE scale, and endpoitns for exercise. Pt is walking 30 minutes daily, and swimming occassionally as his mode of home exercise.  Patient is walking 20-30 minutes daily in addition to exercise at cardiac rehab.  Patient is progressing well with exercise, achieving 4.4 METs. Patient feels strength and stamina has increased.    Expected Outcomes  Increase workloads as tolerated.  Patient will continue daily walking in addition to exercise at cardiac reahb to help improve strength and stamina.  Increase workloads as tolerated to help achieve health and fitness goals.  Patient will continue daily exercise routine to improve cardiorespiratory fitness.  Continue current exercise routine, increasing workloads as tolerated.      Discharge Exercise Prescription (Final Exercise Prescription Changes): Exercise Prescription Changes - 03/19/18 1451      Response to Exercise   Blood Pressure (Admit)  136/62    Blood Pressure (Exercise)  168/78    Blood Pressure (Exit)  128/80    Heart Rate (Admit)  93 bpm    Heart  Rate (Exercise)  132 bpm    Heart Rate (Exit)  93 bpm    Rating of Perceived Exertion (Exercise)  12    Symptoms  none    Duration  Progress to 30 minutes of  aerobic without signs/symptoms of physical distress    Intensity  THRR unchanged      Progression   Progression  Continue to progress workloads to maintain intensity without signs/symptoms of physical distress.    Average METs  4.4      Resistance Training   Training Prescription  Yes    Weight  5lbs    Reps  10-15    Time  10 Minutes      Interval Training   Interval Training  No      Recumbant Bike   Level  4    Minutes  10    METs  4.2      NuStep   Level  4    SPM  95    Minutes  10    METs  5.1      Track   Laps  16    Minutes  10    METs  3.78      Home Exercise Plan   Plans to continue exercise at  Home (comment)    Frequency  Add 4 additional days to program exercise  sessions.    Initial Home Exercises Provided  01/31/18       Nutrition:  Target Goals: Understanding of nutrition guidelines, daily intake of sodium 1500mg , cholesterol 200mg , calories 30% from fat and 7% or less from saturated fats, daily to have 5 or more servings of fruits and vegetables.  Biometrics: Pre Biometrics - 01/09/18 1337      Pre Biometrics   Height  5\' 5"  (1.651 m)    Weight  100 kg    Waist Circumference  45.5 inches    Hip Circumference  44.5 inches    Waist to Hip Ratio  1.02 %    BMI (Calculated)  36.69    Triceps Skinfold  20 mm    % Body Fat  35.2 %    Grip Strength  33.5 kg    Flexibility  8.5 in    Single Leg Stand  15.22 seconds        Nutrition Therapy Plan and Nutrition Goals: Nutrition Therapy & Goals - 01/10/18 0840      Nutrition Therapy   Diet  consistent carbohydrate heart healthy      Personal Nutrition Goals   Nutrition Goal  Pt to identify and limit food sources of saturated fat, trans fat, refined carbohydrates and sodium    Personal Goal #2  Pt to identify food quantities  necessary to achieve weight loss of 6-24 lbs. at graduation from cardiac rehab.      Intervention Plan   Intervention  Prescribe, educate and counsel regarding individualized specific dietary modifications aiming towards targeted core components such as weight, hypertension, lipid management, diabetes, heart failure and other comorbidities.    Expected Outcomes  Short Term Goal: Understand basic principles of dietary content, such as calories, fat, sodium, cholesterol and nutrients.       Nutrition Assessments: Nutrition Assessments - 01/10/18 0843      MEDFICTS Scores   Pre Score  49       Nutrition Goals Re-Evaluation:   Nutrition Goals Re-Evaluation:   Nutrition Goals Discharge (Final Nutrition Goals Re-Evaluation):   Psychosocial: Target Goals: Acknowledge presence or absence of significant depression and/or stress, maximize coping skills, provide positive support system. Participant is able to verbalize types and ability to use techniques and skills needed for reducing stress and depression.  Initial Review & Psychosocial Screening: Initial Psych Review & Screening - 01/09/18 1517      Initial Review   Current issues with  None Identified      Family Dynamics   Good Support System?  Yes   family, church, friends    Comments  no psychosocial needs identified, no interventions necessary       Barriers   Psychosocial barriers to participate in program  There are no identifiable barriers or psychosocial needs.      Screening Interventions   Interventions  Encouraged to exercise       Quality of Life Scores: Quality of Life - 01/09/18 1518      Quality of Life   Select  --   did not complete questionnaire     Scores of 19 and below usually indicate a poorer quality of life in these areas.  A difference of  2-3 points is a clinically meaningful difference.  A difference of 2-3 points in the total score of the Quality of Life Index has been associated with  significant improvement in overall quality of life, self-image, physical symptoms, and general health in studies assessing change in quality of  life.  PHQ-9: Recent Review Flowsheet Data    Depression screen [facility] 2/9 01/15/2018   Decreased Interest 0   Down, Depressed, Hopeless 0   PHQ - 2 Score 0     Interpretation of Total Score  Total Score Depression Severity:  1-4 = Minimal depression, 5-9 = Mild depression, 10-14 = Moderate depression, 15-19 = Moderately severe depression, 20-27 = Severe depression   Psychosocial Evaluation and Intervention:   Psychosocial Re-Evaluation: Psychosocial Re-Evaluation    [place] Name 01/31/18 1720 03/01/18 1449 03/29/18 1535         Psychosocial Re-Evaluation   Current issues with  None Identified  None Identified  None Identified     Interventions  Encouraged to attend Cardiac Rehabilitation for the exercise  Encouraged to attend Cardiac Rehabilitation for the exercise  Encouraged to attend Cardiac Rehabilitation for the exercise     Continue Psychosocial Services   No Follow up required  No Follow up required  No Follow up required        Psychosocial Discharge (Final Psychosocial Re-Evaluation): Psychosocial Re-Evaluation - 03/29/18 1535      Psychosocial Re-Evaluation   Current issues with  None Identified    Interventions  Encouraged to attend Cardiac Rehabilitation for the exercise    Continue Psychosocial Services   No Follow up required       Vocational Rehabilitation: Provide vocational rehab assistance to qualifying candidates.   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 Participant will state  understanding/return demonstration of topics presented.  Learning Barriers/Preferences: Learning Barriers/Preferences - 01/09/18 1518      Learning Barriers/Preferences   Learning Barriers  None    Learning Preferences  None       Education Topics: Count Your Pulse:  -Group instruction provided by verbal instruction, demonstration, patient participation and written materials to support subject.  Instructors address importance of being able to find your pulse and how to count your pulse when at home without a heart monitor.  Patients get hands on experience counting their pulse with staff help and individually.   Heart Attack, Angina, and Risk Factor Modification:  -Group instruction provided by verbal instruction, video, and written materials to support subject.  Instructors address signs and symptoms of angina and heart attacks.    Also discuss risk factors for heart disease and how to make changes to improve heart health risk factors.   CARDIAC REHAB PHASE II EXERCISE from 03/14/2018 in [place]  Date  02/28/18  Instruction Review Code  2- Demonstrated Understanding      Functional Fitness:  -Group instruction provided by verbal instruction, demonstration, patient participation, and written materials to support subject.  Instructors address safety measures for doing things around the house.  Discuss how to get up and down off the floor, how to pick things up properly, how to safely get out of a chair without assistance, and balance training.   Meditation and Mindfulness:  -Group instruction provided by verbal instruction, patient participation, and written materials to support subject.  Instructor addresses importance of mindfulness and meditation practice to help reduce stress and improve awareness.  Instructor also leads participants through  a meditation exercise.    CARDIAC REHAB PHASE II EXERCISE from 03/14/2018 in [place]  Date  01/24/18  Educator  Jeanella Craze  Instruction Review Code  2- Demonstrated Understanding      Stretching for Flexibility and Mobility:  -Group instruction provided by verbal instruction, patient participation, and written materials to support subject.  Instructors lead participants through series of stretches that are designed to increase flexibility thus improving mobility.  These stretches are additional exercise for major muscle groups that are typically performed during regular warm up and cool down.   Hands Only CPR:  -Group verbal, video, and participation provides a basic overview of AHA guidelines for community CPR. Role-play of emergencies allow participants the opportunity to practice calling for help and chest compression technique with discussion of AED use.   Hypertension: -Group verbal and written instruction that provides a basic overview of hypertension including the most recent diagnostic guidelines, risk factor reduction with self-care instructions and medication management.    Nutrition I class: Heart Healthy Eating:  -Group instruction provided by PowerPoint slides, verbal discussion, and written materials to support subject matter. The instructor gives an explanation and review of the Therapeutic Lifestyle Changes diet recommendations, which includes a discussion on lipid goals, dietary fat, sodium, fiber, plant stanol/sterol esters, sugar, and the components of a well-balanced, healthy diet.   Nutrition II class: Lifestyle Skills:  -Group instruction provided by PowerPoint slides, verbal discussion, and written materials to support subject matter. The instructor gives an explanation and review of label reading, grocery shopping for heart health, heart healthy recipe modifications, and ways to make healthier choices when eating out.   Diabetes Question & Answer:  -Group instruction provided by PowerPoint slides, verbal discussion, and written  materials to support subject matter. The instructor gives an explanation and review of diabetes co-morbidities, pre- and post-prandial blood glucose goals, pre-exercise blood glucose goals, signs, symptoms, and treatment of hypoglycemia and hyperglycemia, and foot care basics.   Diabetes Blitz:  -Group instruction provided by PowerPoint slides, verbal discussion, and written materials to support subject matter. The instructor gives an explanation and review of the physiology behind type 1 and type 2 diabetes, diabetes medications and rational behind using different medications, pre- and post-prandial blood glucose recommendations and Hemoglobin A1c goals, diabetes diet, and exercise including blood glucose guidelines for exercising safely.    Portion Distortion:  -Group instruction provided by PowerPoint slides, verbal discussion, written materials, and food models to support subject matter. The instructor gives an explanation of serving size versus portion size, changes in portions sizes over the last 20 years, and what consists of a serving from each food group.   Stress Management:  -Group instruction provided by verbal instruction, video, and written materials to support subject matter.  Instructors review role of stress in heart disease and how to cope with stress positively.     CARDIAC REHAB PHASE II EXERCISE from 03/14/2018 in [place]  Date  02/14/18  Instruction Review Code  2- Demonstrated Understanding      Exercising on Your Own:  -Group instruction provided by verbal instruction, power point, and written materials to support subject.  Instructors discuss benefits of exercise, components of exercise, frequency and intensity of exercise, and end points for exercise.  Also discuss use of nitroglycerin and activating EMS.  Review options of places to exercise outside of rehab.  Review guidelines for sex with heart disease.   CARDIAC REHAB PHASE  II  EXERCISE from 03/14/2018 in [place]  Date  03/14/18  Educator  EP  Instruction Review Code  2- Demonstrated Understanding      Cardiac Drugs I:  -Group instruction provided by verbal instruction and written materials to support subject.  Instructor reviews cardiac drug classes: antiplatelets, anticoagulants, beta blockers, and statins.  Instructor discusses reasons, side effects, and lifestyle considerations for each drug class.   Cardiac Drugs II:  -Group instruction provided by verbal instruction and written materials to support subject.  Instructor reviews cardiac drug classes: angiotensin converting enzyme inhibitors (ACE-I), angiotensin II receptor blockers (ARBs), nitrates, and calcium channel blockers.  Instructor discusses reasons, side effects, and lifestyle considerations for each drug class.   CARDIAC REHAB PHASE II EXERCISE from 03/14/2018 in [place]  Date  03/07/18  Educator  Pharmacist  Instruction Review Code  2- Demonstrated Understanding      Anatomy and Physiology of the Circulatory System:  Group verbal and written instruction and models provide basic cardiac anatomy and physiology, with the coronary electrical and arterial systems. Review of: AMI, Angina, Valve disease, Heart Failure, Peripheral Artery Disease, Cardiac Arrhythmia, Pacemakers, and the ICD.   CARDIAC REHAB PHASE II EXERCISE from 03/14/2018 in [place]  Date  02/21/18  Educator  RN  Instruction Review Code  2- Demonstrated Understanding      Other Education:  -Group or individual verbal, written, or video instructions that support the educational goals of the cardiac rehab program.   Holiday Eating Survival Tips:  -Group instruction provided by PowerPoint slides, verbal discussion, and written materials to support subject matter. The instructor gives patients tips, tricks, and techniques to help  them not only survive but enjoy the holidays despite the onslaught of food that accompanies the holidays.   Knowledge Questionnaire Score: Knowledge Questionnaire Score - 01/09/18 1518      Knowledge Questionnaire Score   Pre Score  20/24       Core Components/Risk Factors/Patient Goals at Admission: Personal Goals and Risk Factors at Admission - 01/09/18 1544      Core Components/Risk Factors/Patient Goals on Admission    Weight Management  Yes;Obesity    Intervention  Weight Management: Develop a combined nutrition and exercise program designed to reach desired caloric intake, while maintaining appropriate intake of nutrient and fiber, sodium and fats, and appropriate energy expenditure required for the weight goal.;Weight Management: Provide education and appropriate resources to help participant work on and attain dietary goals.;Weight Management/Obesity: Establish reasonable short term and long term weight goals.;Obesity: Provide education and appropriate resources to help participant work on and attain dietary goals.    Admit Weight  220 lb 7.4 oz (100 kg)    Goal Weight: Short Term  214 lb (97.1 kg)    Goal Weight: Long Term  208 lb (94.3 kg)    Expected Outcomes  Short Term: Continue to assess and modify interventions until short term weight is achieved;Long Term: Adherence to nutrition and physical activity/exercise program aimed toward attainment of established weight goal;Weight Loss: Understanding of general recommendations for a balanced deficit meal plan, which promotes 1-2 lb weight loss per week and includes a negative energy balance of [phone] kcal/d;Understanding recommendations for meals to include 15-35% energy as protein, 25-35% energy from fat, 35-60% energy from carbohydrates, less than 200mg  of dietary cholesterol, 20-35 gm of total fiber daily;Understanding of distribution of calorie intake throughout the day with the consumption of  4-5 meals/snacks    Diabetes  Yes     Intervention  Provide education about signs/symptoms and action to take for hypo/hyperglycemia.;Provide education about proper nutrition, including hydration, and aerobic/resistive exercise prescription along with prescribed medications to achieve blood glucose in normal ranges: Fasting glucose 65-99 mg/dL    Expected Outcomes  Short Term: Participant verbalizes understanding of the signs/symptoms and immediate care of hyper/hypoglycemia, proper foot care and importance of medication, aerobic/resistive exercise and nutrition plan for blood glucose control.;Long Term: Attainment of HbA1C < 7%.    Lipids  Yes    Intervention  Provide education and support for participant on nutrition & aerobic/resistive exercise along with prescribed medications to achieve LDL 70mg , HDL >40mg .    Expected Outcomes  Short Term: Participant states understanding of desired cholesterol values and is compliant with medications prescribed. Participant is following exercise prescription and nutrition guidelines.;Long Term: Cholesterol controlled with medications as prescribed, with individualized exercise RX and with personalized nutrition plan. Value goals: LDL < 70mg , HDL > 40 mg.       Core Components/Risk Factors/Patient Goals Review:  Goals and Risk Factor Review    Row Name 01/31/18 1721 03/01/18 1449 03/29/18 1535         Core Components/Risk Factors/Patient Goals Review   Personal Goals Review  Weight Management/Obesity;Lipids;Diabetes  Weight Management/Obesity;Lipids;Diabetes  Weight Management/Obesity;Lipids;Diabetes     Review  Sam's Vital signs and CBG's have been stable at cardiac rehab.  Sam's Vital signs and CBG's have been stable at cardiac rehab.  Sam's Vital signs and CBG's have been stable at cardiac rehab.     Expected Outcomes  Sam's will continue to participate in phase 2 cardiac rehab and apply lifestyle modificaitons to his ADL's  Sam's will continue to participate in phase 2 cardiac rehab and apply  lifestyle modificaitons to his ADL's  Sam's will continue to participate in phase 2 cardiac rehab and apply lifestyle modificaitons to his ADL's        Core Components/Risk Factors/Patient Goals at Discharge (Final Review):  Goals and Risk Factor Review - 03/29/18 1535      Core Components/Risk Factors/Patient Goals Review   Personal Goals Review  Weight Management/Obesity;Lipids;Diabetes    Review  Sam's Vital signs and CBG's have been stable at cardiac rehab.    Expected Outcomes  Sam's will continue to participate in phase 2 cardiac rehab and apply lifestyle modificaitons to his ADL's       ITP Comments: ITP Comments    Row Name 01/09/18 1358 01/31/18 1714 03/01/18 1448 03/29/18 1533     ITP Comments  Medical Director- Dr. Fransico Him, MD  30 Day ITP Review. Sal has been enjoying particpating in phase 2 cardiac rehab. Sal particpates one to two days a week due to his work schedule  30 Day ITP Review. Sal has been enjoying particpating in phase 2 cardiac rehab. Sal particpates one to two days a week due to his work schedule  30 Day ITP Review. Sam has good partcipation and attendance in phase 2 cardiac rehab       Comments: See ITP comments.Barnet Pall, RN,BSN 03/29/2018 3:37 PM

## 2018-03-30 ENCOUNTER — Encounter (HOSPITAL_COMMUNITY): Payer: PPO

## 2018-04-02 ENCOUNTER — Encounter (HOSPITAL_COMMUNITY): Payer: PPO

## 2018-04-04 ENCOUNTER — Encounter (HOSPITAL_COMMUNITY)
Admission: RE | Admit: 2018-04-04 | Discharge: 2018-04-04 | Disposition: A | Discharge status: Home / Self Care | Payer: PPO | Source: Ambulatory Visit | Attending: Cardiovascular Disease | Admitting: Cardiovascular Disease

## 2018-04-04 ENCOUNTER — Encounter (HOSPITAL_COMMUNITY): Payer: PPO

## 2018-04-04 VITALS — Ht 65.0 in | Wt 217.8 lb

## 2018-04-04 DIAGNOSIS — Z7902 Long term (current) use of antithrombotics/antiplatelets: Secondary | ICD-10-CM | POA: Diagnosis not present

## 2018-04-04 DIAGNOSIS — E119 Type 2 diabetes mellitus without complications: Secondary | ICD-10-CM | POA: Insufficient documentation

## 2018-04-04 DIAGNOSIS — I2510 Atherosclerotic heart disease of native coronary artery without angina pectoris: Secondary | ICD-10-CM | POA: Insufficient documentation

## 2018-04-04 DIAGNOSIS — Z7982 Long term (current) use of aspirin: Secondary | ICD-10-CM | POA: Insufficient documentation

## 2018-04-04 DIAGNOSIS — K219 Gastro-esophageal reflux disease without esophagitis: Secondary | ICD-10-CM | POA: Insufficient documentation

## 2018-04-04 DIAGNOSIS — Z955 Presence of coronary angioplasty implant and graft: Secondary | ICD-10-CM | POA: Diagnosis not present

## 2018-04-04 DIAGNOSIS — Z7984 Long term (current) use of oral hypoglycemic drugs: Secondary | ICD-10-CM | POA: Insufficient documentation

## 2018-04-04 DIAGNOSIS — E7800 Pure hypercholesterolemia, unspecified: Secondary | ICD-10-CM | POA: Insufficient documentation

## 2018-04-04 DIAGNOSIS — Z79899 Other long term (current) drug therapy: Secondary | ICD-10-CM | POA: Insufficient documentation

## 2018-04-04 DIAGNOSIS — F419 Anxiety disorder, unspecified: Secondary | ICD-10-CM | POA: Diagnosis not present

## 2018-04-04 DIAGNOSIS — Z87891 Personal history of nicotine dependence: Secondary | ICD-10-CM | POA: Diagnosis not present

## 2018-04-06 ENCOUNTER — Encounter (HOSPITAL_COMMUNITY): Payer: PPO

## 2018-04-09 ENCOUNTER — Encounter (HOSPITAL_COMMUNITY): Payer: PPO

## 2018-04-09 ENCOUNTER — Encounter (HOSPITAL_COMMUNITY)
Admission: RE | Admit: 2018-04-09 | Discharge: 2018-04-09 | Disposition: A | Discharge status: Home / Self Care | Payer: PPO | Source: Ambulatory Visit | Attending: Cardiovascular Disease | Admitting: Cardiovascular Disease

## 2018-04-09 DIAGNOSIS — Z955 Presence of coronary angioplasty implant and graft: Secondary | ICD-10-CM

## 2018-04-11 ENCOUNTER — Encounter (HOSPITAL_COMMUNITY)
Admission: RE | Admit: 2018-04-11 | Discharge: 2018-04-11 | Disposition: A | Discharge status: Home / Self Care | Payer: PPO | Source: Ambulatory Visit | Attending: Cardiovascular Disease | Admitting: Cardiovascular Disease

## 2018-04-11 ENCOUNTER — Encounter (HOSPITAL_COMMUNITY): Payer: PPO

## 2018-04-11 DIAGNOSIS — Z955 Presence of coronary angioplasty implant and graft: Secondary | ICD-10-CM | POA: Diagnosis not present

## 2018-04-13 ENCOUNTER — Encounter (HOSPITAL_COMMUNITY): Payer: PPO

## 2018-04-16 ENCOUNTER — Encounter (HOSPITAL_COMMUNITY)
Admission: RE | Admit: 2018-04-16 | Discharge: 2018-04-16 | Disposition: A | Discharge status: Home / Self Care | Payer: PPO | Source: Ambulatory Visit | Attending: Cardiovascular Disease | Admitting: Cardiovascular Disease

## 2018-04-16 ENCOUNTER — Encounter (HOSPITAL_COMMUNITY): Payer: PPO

## 2018-04-16 VITALS — BP 138/82 | HR 100 | Ht 65.0 in | Wt 218.3 lb

## 2018-04-16 DIAGNOSIS — Z955 Presence of coronary angioplasty implant and graft: Secondary | ICD-10-CM

## 2018-04-16 NOTE — Progress Notes (Addendum)
Discharge Progress Report  Patient Details  Name: Connor Wright MRN: [id number] Date of Birth: 1950-06-03 Referring Provider:     [place] from 01/09/2018 in [place]  Referring Provider  Quay Burow, MD       Number of Visits: 24  Reason for Discharge:  Patient reached a stable level of exercise. Patient independent in their exercise. Patient has met program and personal goals.  Smoking History:  Social History   Tobacco Use  Smoking Status Former Smoker  . Packs/day: 1.00  . Years: 35.00  . Pack years: 35.00  . Types: Cigarettes  . Last attempt to quit: 2007  . Years since quitting: 12.8  Smokeless Tobacco Never Used    Diagnosis:  11/09/2017 Stented coronary artery-CFX  ADL UCSD:   Initial Exercise Prescription: Initial Exercise Prescription - 01/09/18 1400      Date of Initial Exercise RX and Referring Provider   Date  01/09/18    Referring Provider  Quay Burow, MD      Bike   Level  0.8    Minutes  10    METs  2.51      NuStep   Level  2    SPM  85    Minutes  10    METs  2.5      Track   Laps  11    Minutes  10    METs  2.92      Prescription Details   Frequency (times per week)  2    Duration  Progress to 30 minutes of continuous aerobic without signs/symptoms of physical distress      Intensity   THRR 40-80% of Max Heartrate  61-122    Ratings of Perceived Exertion  11-13    Perceived Dyspnea  0-4      Progression   Progression  Continue to progress workloads to maintain intensity without signs/symptoms of physical distress.      Resistance Training   Training Prescription  Yes    Weight  2lbs    Reps  10-15       Discharge Exercise Prescription (Final Exercise Prescription Changes): Exercise Prescription Changes - 04/16/18 1502      Response to Exercise   Blood Pressure (Admit)  138/82    Blood Pressure (Exercise)  144/80    Blood Pressure (Exit)   128/84    Heart Rate (Admit)  100 bpm    Heart Rate (Exercise)  136 bpm    Heart Rate (Exit)  104 bpm    Rating of Perceived Exertion (Exercise)  13    Symptoms  none    Duration  Progress to 30 minutes of  aerobic without signs/symptoms of physical distress    Intensity  THRR unchanged      Progression   Progression  Continue to progress workloads to maintain intensity without signs/symptoms of physical distress.    Average METs  4.6      Resistance Training   Training Prescription  Yes    Weight  5lbs    Reps  10-15    Time  10 Minutes      Interval Training   Interval Training  No      Recumbant Bike   Level  4    Minutes  10    METs  4.8      NuStep   Level  4    SPM  95    Minutes  10    METs  5.1      Track   Laps  16    Minutes  10    METs  3.78      Home Exercise Plan   Plans to continue exercise at  Home (comment)    Frequency  Add 4 additional days to program exercise sessions.    Initial Home Exercises Provided  01/31/18       Functional Capacity: 6 Minute Walk    Row Name 01/09/18 1337 04/05/18 0744       6 Minute Walk   Phase  Initial  Discharge    Distance  1322 feet  1607 feet    Distance % Change  -  21.56 %    Distance Feet Change  -  285 ft    Walk Time  6 minutes  6 minutes    # of Rest Breaks  0  0    MPH  2.5  3.04    METS  2.56  3.49    RPE  11  10    VO2 Peak  8.96  12.2    Symptoms  No  No    Resting HR  85 bpm  96 bpm    Resting BP  124/82  118/70    Resting Oxygen Saturation   98 %  -    Exercise Oxygen Saturation  during 6 min walk  97 %  -    Max Ex. HR  105 bpm  125 bpm    Max Ex. BP  124/86  148/78    2 Minute Post BP  100/74  120/70       Psychological, QOL, Others - Outcomes: PHQ 2/9: Depression screen [facility] 2/9 04/16/2018 01/15/2018  Decreased Interest 0 0  Down, Depressed, Hopeless 0 0  PHQ - 2 Score 0 0    Quality of Life: Quality of Life - 04/10/18 1133      Quality of Life   Select  Quality of Life       Quality of Life Scores   Health/Function Post  27.6 %    Socioeconomic Post  30 %    Psych/Spiritual Post  30 %    Family Post  28.8 %    GLOBAL Post  28.8 %       Personal Goals: Goals established at orientation with interventions provided to work toward goal. Personal Goals and Risk Factors at Admission - 01/09/18 1544      Core Components/Risk Factors/Patient Goals on Admission    Weight Management  Yes;Obesity    Intervention  Weight Management: Develop a combined nutrition and exercise program designed to reach desired caloric intake, while maintaining appropriate intake of nutrient and fiber, sodium and fats, and appropriate energy expenditure required for the weight goal.;Weight Management: Provide education and appropriate resources to help participant work on and attain dietary goals.;Weight Management/Obesity: Establish reasonable short term and long term weight goals.;Obesity: Provide education and appropriate resources to help participant work on and attain dietary goals.    Admit Weight  220 lb 7.4 oz (100 kg)    Goal Weight: Short Term  214 lb (97.1 kg)    Goal Weight: Long Term  208 lb (94.3 kg)    Expected Outcomes  Short Term: Continue to assess and modify interventions until short term weight is achieved;Long Term: Adherence to nutrition and physical activity/exercise program aimed toward attainment of established weight goal;Weight Loss: Understanding of general recommendations for a balanced  deficit meal plan, which promotes 1-2 lb weight loss per week and includes a negative energy balance of [phone] kcal/d;Understanding recommendations for meals to include 15-35% energy as protein, 25-35% energy from fat, 35-60% energy from carbohydrates, less than 231m of dietary cholesterol, 20-35 gm of total fiber daily;Understanding of distribution of calorie intake throughout the day with the consumption of 4-5 meals/snacks    Diabetes  Yes    Intervention  Provide education  about signs/symptoms and action to take for hypo/hyperglycemia.;Provide education about proper nutrition, including hydration, and aerobic/resistive exercise prescription along with prescribed medications to achieve blood glucose in normal ranges: Fasting glucose 65-99 mg/dL    Expected Outcomes  Short Term: Participant verbalizes understanding of the signs/symptoms and immediate care of hyper/hypoglycemia, proper foot care and importance of medication, aerobic/resistive exercise and nutrition plan for blood glucose control.;Long Term: Attainment of HbA1C < 7%.    Lipids  Yes    Intervention  Provide education and support for participant on nutrition & aerobic/resistive exercise along with prescribed medications to achieve LDL <739m HDL >4065m   Expected Outcomes  Short Term: Participant states understanding of desired cholesterol values and is compliant with medications prescribed. Participant is following exercise prescription and nutrition guidelines.;Long Term: Cholesterol controlled with medications as prescribed, with individualized exercise RX and with personalized nutrition plan. Value goals: LDL < 58m31mDL > 40 mg.        Personal Goals Discharge: Goals and Risk Factor Review    Row Name 01/31/18 1721 03/01/18 1449 03/29/18 1535 04/16/18 1636       Core Components/Risk Factors/Patient Goals Review   Personal Goals Review  Weight Management/Obesity;Lipids;Diabetes  Weight Management/Obesity;Lipids;Diabetes  Weight Management/Obesity;Lipids;Diabetes  Weight Management/Obesity;Lipids;Diabetes    Review  Sam's Vital signs and CBG's have been stable at cardiac rehab.  Sam's Vital signs and CBG's have been stable at cardiac rehab.  Sam's Vital signs and CBG's have been stable at cardiac rehab.  Sam's Vital signs and CBG's have been stable at cardiac rehab.    Expected Outcomes  Sam's will continue to participate in phase 2 cardiac rehab and apply lifestyle modificaitons to his ADL's  Sam's will  continue to participate in phase 2 cardiac rehab and apply lifestyle modificaitons to his ADL's  Sam's will continue to participate in phase 2 cardiac rehab and apply lifestyle modificaitons to his ADL's  Sam's will continue to exercise [facility] the club [facility], walk on his own and apply lifestyle modificaitons to his ADL's upon graduation from phase 2 cardiac rehab.       Exercise Goals and Review: Exercise Goals    Row Name 01/09/18 1405             Exercise Goals   Increase Physical Activity  Yes       Intervention  Provide advice, education, support and counseling about physical activity/exercise needs.;Develop an individualized exercise prescription for aerobic and resistive training based on initial evaluation findings, risk stratification, comorbidities and participant's personal goals.       Expected Outcomes  Short Term: Attend rehab on a regular basis to increase amount of physical activity.;Long Term: Exercising regularly at least 3-5 days a week.;Long Term: Add in home exercise to make exercise part of routine and to increase amount of physical activity.       Increase Strength and Stamina  Yes       Intervention  Provide advice, education, support and counseling about physical activity/exercise needs.;Develop an individualized exercise prescription for aerobic and resistive  training based on initial evaluation findings, risk stratification, comorbidities and participant's personal goals.       Expected Outcomes  Short Term: Increase workloads from initial exercise prescription for resistance, speed, and METs.;Short Term: Perform resistance training exercises routinely during rehab and add in resistance training at home;Long Term: Improve cardiorespiratory fitness, muscular endurance and strength as measured by increased METs and functional capacity (6MWT)       Able to understand and use rate of perceived exertion (RPE) scale  Yes       Intervention  Provide education and explanation on how  to use RPE scale       Expected Outcomes  Short Term: Able to use RPE daily in rehab to express subjective intensity level;Long Term:  Able to use RPE to guide intensity level when exercising independently       Knowledge and understanding of Target Heart Rate Range (THRR)  Yes       Intervention  Provide education and explanation of THRR including how the numbers were predicted and where they are located for reference       Expected Outcomes  Short Term: Able to state/look up THRR;Long Term: Able to use THRR to govern intensity when exercising independently;Short Term: Able to use daily as guideline for intensity in rehab       Able to check pulse independently  Yes       Intervention  Provide education and demonstration on how to check pulse in carotid and radial arteries.;Review the importance of being able to check your own pulse for safety during independent exercise       Expected Outcomes  Short Term: Able to explain why pulse checking is important during independent exercise;Long Term: Able to check pulse independently and accurately       Understanding of Exercise Prescription  Yes       Intervention  Provide education, explanation, and written materials on patient's individual exercise prescription       Expected Outcomes  Short Term: Able to explain program exercise prescription;Long Term: Able to explain home exercise prescription to exercise independently          Exercise Goals Re-Evaluation: Exercise Goals Re-Evaluation    Row Name 01/15/18 1540 01/22/18 1534 01/31/18 1534 02/26/18 1507 03/21/18 1532     Exercise Goal Re-Evaluation   Exercise Goals Review  Able to understand and use rate of perceived exertion (RPE) scale  Increase Physical Activity  Understanding of Exercise Prescription;Knowledge and understanding of Target Heart Rate Range (THRR);Able to understand and use rate of perceived exertion (RPE) scale  Understanding of Exercise Prescription;Knowledge and understanding of  Target Heart Rate Range (THRR);Able to understand and use rate of perceived exertion (RPE) scale;Increase Physical Activity  Understanding of Exercise Prescription;Knowledge and understanding of Target Heart Rate Range (THRR);Able to understand and use rate of perceived exertion (RPE) scale;Increase Physical Activity;Increase Strength and Stamina   Comments  Patient able to use and understand the RPE scale appropriately.  Patient is doing well with exercise at cardiac rehab. Pt staes that he is walking 20-30 minutes daily.  Reviewed home exercise guidelines with patient including THRR, RPE scale, and endpoitns for exercise. Pt is walking 30 minutes daily, and swimming occassionally as his mode of home exercise.  Patient is walking 20-30 minutes daily in addition to exercise at cardiac rehab.  Patient is progressing well with exercise, achieving 4.4 METs. Patient feels strength and stamina has increased.    Expected Outcomes  Increase workloads as tolerated.  Patient will continue daily walking in addition to exercise at cardiac reahb to help improve strength and stamina.  Increase workloads as tolerated to help achieve health and fitness goals.  Patient will continue daily exercise routine to improve cardiorespiratory fitness.  Continue current exercise routine, increasing workloads as tolerated.   [place] Name 04/16/18 1502             Exercise Goal Re-Evaluation   Exercise Goals Review  Understanding of Exercise Prescription;Knowledge and understanding of Target Heart Rate Range (THRR);Able to understand and use rate of perceived exertion (RPE) scale;Increase Physical Activity;Increase Strength and Stamina       Comments  Patient completed the phase 2 cardiac rehab program and has done well. Pt plans to continue walking 30 minutes daily and swimming when the weather permits.       Expected Outcomes  Patient will exercise 30 minutes daily to help maintain health and fitness gains.          Nutrition &  Weight - Outcomes: Pre Biometrics - 01/09/18 1337      Pre Biometrics   Height  '5\' 5"'  (1.651 m)    Weight  220 lb 7.4 oz (100 kg)    Waist Circumference  45.5 inches    Hip Circumference  44.5 inches    Waist to Hip Ratio  1.02 %    BMI (Calculated)  36.69    Triceps Skinfold  20 mm    % Body Fat  35.2 %    Grip Strength  33.5 kg    Flexibility  8.5 in    Single Leg Stand  15.22 seconds      Post Biometrics - 04/16/18 1603       Post  Biometrics   Height  '5\' 5"'  (1.651 m)    Weight  218 lb 4.1 oz (99 kg)    Waist Circumference  46 inches    Hip Circumference  44 inches    Waist to Hip Ratio  1.05 %    BMI (Calculated)  36.32    Triceps Skinfold  23 mm    % Body Fat  35.8 %    Grip Strength  40 kg    Flexibility  9.5 in    Single Leg Stand  14.25 seconds       Nutrition: Nutrition Therapy & Goals - 01/10/18 0840      Nutrition Therapy   Diet  consistent carbohydrate heart healthy      Personal Nutrition Goals   Nutrition Goal  Pt to identify and limit food sources of saturated fat, trans fat, refined carbohydrates and sodium    Personal Goal #2  Pt to identify food quantities necessary to achieve weight loss of 6-24 lbs. at graduation from cardiac rehab.      Intervention Plan   Intervention  Prescribe, educate and counsel regarding individualized specific dietary modifications aiming towards targeted core components such as weight, hypertension, lipid management, diabetes, heart failure and other comorbidities.    Expected Outcomes  Short Term Goal: Understand basic principles of dietary content, such as calories, fat, sodium, cholesterol and nutrients.       Nutrition Discharge: Nutrition Assessments - 04/10/18 1047      MEDFICTS Scores   Pre Score  49    Post Score  28    Score Difference  -21       Education Questionnaire Score: Knowledge Questionnaire Score - 04/13/18 1600      Knowledge Questionnaire Score  Pre Score  20/24    Post Score  22/24        Goals reviewed with patient; copy given to patient.Sam graduated from cardiac rehab program today with completion of 24 exercise sessions in Phase II. Pt maintained good attendance and progressed nicely during his participation in rehab as evidenced by increased MET level.   Medication list reconciled. Repeat  PHQ score-  0.  Pt has made significant lifestyle changes and should be commended for his success. Pt feels he has achieved his goals during cardiac rehab.   Pt plans to continue exercise at the club house and walk on his own. We are proud of Sam's progress. Sam increased his distance on his post exercise walk test and lost 2 pounds while he participated in the program. We are proud of Sam's progress.Barnet Pall, RN,BSN 04/20/2018 12:02 PM

## 2018-04-18 ENCOUNTER — Encounter (HOSPITAL_COMMUNITY): Payer: PPO

## 2018-04-23 ENCOUNTER — Encounter (HOSPITAL_COMMUNITY): Payer: PPO

## 2018-04-25 ENCOUNTER — Encounter (HOSPITAL_COMMUNITY): Payer: PPO

## 2018-04-30 ENCOUNTER — Encounter (HOSPITAL_COMMUNITY): Payer: PPO

## 2018-05-01 DIAGNOSIS — M25511 Pain in right shoulder: Secondary | ICD-10-CM | POA: Diagnosis not present

## 2018-05-02 ENCOUNTER — Encounter (HOSPITAL_COMMUNITY): Payer: PPO

## 2018-05-03 DIAGNOSIS — D649 Anemia, unspecified: Secondary | ICD-10-CM | POA: Diagnosis not present

## 2018-05-03 DIAGNOSIS — E1129 Type 2 diabetes mellitus with other diabetic kidney complication: Secondary | ICD-10-CM | POA: Diagnosis not present

## 2018-05-03 DIAGNOSIS — Z23 Encounter for immunization: Secondary | ICD-10-CM | POA: Diagnosis not present

## 2018-05-03 DIAGNOSIS — E118 Type 2 diabetes mellitus with unspecified complications: Secondary | ICD-10-CM | POA: Diagnosis not present

## 2018-05-03 DIAGNOSIS — D559 Anemia due to enzyme disorder, unspecified: Secondary | ICD-10-CM | POA: Diagnosis not present

## 2018-05-03 DIAGNOSIS — R809 Proteinuria, unspecified: Secondary | ICD-10-CM | POA: Diagnosis not present

## 2018-05-03 DIAGNOSIS — I2510 Atherosclerotic heart disease of native coronary artery without angina pectoris: Secondary | ICD-10-CM | POA: Diagnosis not present

## 2018-05-07 ENCOUNTER — Encounter (HOSPITAL_COMMUNITY): Payer: PPO

## 2018-05-09 ENCOUNTER — Encounter (HOSPITAL_COMMUNITY): Payer: PPO

## 2018-05-14 ENCOUNTER — Encounter (HOSPITAL_COMMUNITY): Payer: PPO

## 2018-05-16 ENCOUNTER — Encounter (HOSPITAL_COMMUNITY): Payer: PPO

## 2018-05-16 DIAGNOSIS — E119 Type 2 diabetes mellitus without complications: Secondary | ICD-10-CM | POA: Diagnosis not present

## 2018-05-21 ENCOUNTER — Encounter (HOSPITAL_COMMUNITY): Payer: PPO

## 2018-05-28 ENCOUNTER — Encounter (HOSPITAL_COMMUNITY): Payer: PPO

## 2018-06-04 ENCOUNTER — Encounter (HOSPITAL_COMMUNITY): Payer: PPO

## 2018-12-13 DIAGNOSIS — E118 Type 2 diabetes mellitus with unspecified complications: Secondary | ICD-10-CM | POA: Diagnosis not present

## 2018-12-13 DIAGNOSIS — I2510 Atherosclerotic heart disease of native coronary artery without angina pectoris: Secondary | ICD-10-CM | POA: Diagnosis not present

## 2018-12-13 DIAGNOSIS — I10 Essential (primary) hypertension: Secondary | ICD-10-CM | POA: Diagnosis not present

## 2018-12-13 DIAGNOSIS — E1129 Type 2 diabetes mellitus with other diabetic kidney complication: Secondary | ICD-10-CM | POA: Diagnosis not present

## 2019-04-11 DIAGNOSIS — I10 Essential (primary) hypertension: Secondary | ICD-10-CM | POA: Diagnosis not present

## 2019-04-11 DIAGNOSIS — E1129 Type 2 diabetes mellitus with other diabetic kidney complication: Secondary | ICD-10-CM | POA: Diagnosis not present

## 2019-04-11 DIAGNOSIS — I2510 Atherosclerotic heart disease of native coronary artery without angina pectoris: Secondary | ICD-10-CM | POA: Diagnosis not present

## 2019-05-21 DIAGNOSIS — E1129 Type 2 diabetes mellitus with other diabetic kidney complication: Secondary | ICD-10-CM | POA: Diagnosis not present

## 2019-05-21 DIAGNOSIS — E7800 Pure hypercholesterolemia, unspecified: Secondary | ICD-10-CM | POA: Diagnosis not present

## 2019-05-21 DIAGNOSIS — E118 Type 2 diabetes mellitus with unspecified complications: Secondary | ICD-10-CM | POA: Diagnosis not present

## 2019-05-21 DIAGNOSIS — E785 Hyperlipidemia, unspecified: Secondary | ICD-10-CM | POA: Diagnosis not present

## 2019-05-21 DIAGNOSIS — I2510 Atherosclerotic heart disease of native coronary artery without angina pectoris: Secondary | ICD-10-CM | POA: Diagnosis not present

## 2019-05-21 DIAGNOSIS — I10 Essential (primary) hypertension: Secondary | ICD-10-CM | POA: Diagnosis not present

## 2019-07-19 ENCOUNTER — Ambulatory Visit: Payer: PPO

## 2019-07-23 DIAGNOSIS — I10 Essential (primary) hypertension: Secondary | ICD-10-CM | POA: Diagnosis not present

## 2019-07-23 DIAGNOSIS — E7849 Other hyperlipidemia: Secondary | ICD-10-CM | POA: Diagnosis not present

## 2019-07-23 DIAGNOSIS — K219 Gastro-esophageal reflux disease without esophagitis: Secondary | ICD-10-CM | POA: Diagnosis not present

## 2019-07-23 DIAGNOSIS — F419 Anxiety disorder, unspecified: Secondary | ICD-10-CM | POA: Diagnosis not present

## 2019-07-23 DIAGNOSIS — E1169 Type 2 diabetes mellitus with other specified complication: Secondary | ICD-10-CM | POA: Diagnosis not present

## 2019-07-23 DIAGNOSIS — Z95828 Presence of other vascular implants and grafts: Secondary | ICD-10-CM | POA: Diagnosis not present

## 2019-07-23 DIAGNOSIS — D509 Iron deficiency anemia, unspecified: Secondary | ICD-10-CM | POA: Diagnosis not present

## 2019-07-23 DIAGNOSIS — N401 Enlarged prostate with lower urinary tract symptoms: Secondary | ICD-10-CM | POA: Diagnosis not present

## 2019-07-24 DIAGNOSIS — D509 Iron deficiency anemia, unspecified: Secondary | ICD-10-CM | POA: Diagnosis not present

## 2019-07-24 DIAGNOSIS — I10 Essential (primary) hypertension: Secondary | ICD-10-CM | POA: Diagnosis not present

## 2019-08-12 DIAGNOSIS — K921 Melena: Secondary | ICD-10-CM | POA: Diagnosis not present

## 2019-09-12 ENCOUNTER — Telehealth: Payer: PPO | Admitting: Cardiology

## 2019-09-17 ENCOUNTER — Encounter: Payer: Self-pay | Admitting: Cardiology

## 2019-09-17 ENCOUNTER — Telehealth: Payer: Self-pay | Admitting: *Deleted

## 2019-09-17 ENCOUNTER — Telehealth (INDEPENDENT_AMBULATORY_CARE_PROVIDER_SITE_OTHER): Payer: PPO | Admitting: Cardiology

## 2019-09-17 VITALS — Ht 67.0 in | Wt 211.0 lb

## 2019-09-17 DIAGNOSIS — Z8249 Family history of ischemic heart disease and other diseases of the circulatory system: Secondary | ICD-10-CM | POA: Diagnosis not present

## 2019-09-17 DIAGNOSIS — E785 Hyperlipidemia, unspecified: Secondary | ICD-10-CM | POA: Diagnosis not present

## 2019-09-17 DIAGNOSIS — Z9861 Coronary angioplasty status: Secondary | ICD-10-CM

## 2019-09-17 DIAGNOSIS — I2510 Atherosclerotic heart disease of native coronary artery without angina pectoris: Secondary | ICD-10-CM | POA: Diagnosis not present

## 2019-09-17 DIAGNOSIS — E119 Type 2 diabetes mellitus without complications: Secondary | ICD-10-CM

## 2019-09-17 DIAGNOSIS — I10 Essential (primary) hypertension: Secondary | ICD-10-CM

## 2019-09-17 NOTE — Telephone Encounter (Signed)
  Patient Consent for Virtual Visit         Connor Wright has provided verbal consent on 09/17/2019 for a virtual visit (video or telephone).   CONSENT FOR VIRTUAL VISIT FOR:  Connor Wright  By participating in this virtual visit I agree to the following:  I hereby voluntarily request, consent and authorize [facility] and its employed or contracted physicians, physician assistants, nurse practitioners or other licensed health care professionals (the Practitioner), to provide me with telemedicine health care services (the "Services") as deemed necessary by the treating Practitioner. I acknowledge and consent to receive the Services by the Practitioner via telemedicine. I understand that the telemedicine visit will involve communicating with the Practitioner through live audiovisual communication technology and the disclosure of certain medical information by electronic transmission. I acknowledge that I have been given the opportunity to request an in-person assessment or other available alternative prior to the telemedicine visit and am voluntarily participating in the telemedicine visit.  I understand that I have the right to withhold or withdraw my consent to the use of telemedicine in the course of my care at any time, without affecting my right to future care or treatment, and that the Practitioner or I may terminate the telemedicine visit at any time. I understand that I have the right to inspect all information obtained and/or recorded in the course of the telemedicine visit and may receive copies of available information for a reasonable fee.  I understand that some of the potential risks of receiving the Services via telemedicine include:  Marland Kitchen Delay or interruption in medical evaluation due to technological equipment failure or disruption; . Information transmitted may not be sufficient (e.g. poor resolution of images) to allow for appropriate medical decision making by the  Practitioner; and/or  . In rare instances, security protocols could fail, causing a breach of personal health information.  Furthermore, I acknowledge that it is my responsibility to provide information about my medical history, conditions and care that is complete and accurate to the best of my ability. I acknowledge that Practitioner's advice, recommendations, and/or decision may be based on factors not within their control, such as incomplete or inaccurate data provided by me or distortions of diagnostic images or specimens that may result from electronic transmissions. I understand that the practice of medicine is not an exact science and that Practitioner makes no warranties or guarantees regarding treatment outcomes. I acknowledge that a copy of this consent can be made available to me via my patient portal ([facility]), or I can request a printed copy by calling the office of [facility].    I understand that my insurance will be billed for this visit.   I have read or had this consent read to me. . I understand the contents of this consent, which adequately explains the benefits and risks of the Services being provided via telemedicine.  . I have been provided ample opportunity to ask questions regarding this consent and the Services and have had my questions answered to my satisfaction. . I give my informed consent for the services to be provided through the use of telemedicine in my medical care

## 2019-09-17 NOTE — Progress Notes (Signed)
Virtual Visit via Telephone Note   This visit type was conducted due to national recommendations for restrictions regarding the COVID-19 Pandemic (e.g. social distancing) in an effort to limit this patient's exposure and mitigate transmission in our community.  Due to his co-morbid illnesses, this patient is at least at moderate risk for complications without adequate follow up.  This format is felt to be most appropriate for this patient at this time.  The patient did not have access to video technology/had technical difficulties with video requiring transitioning to audio format only (telephone).  All issues noted in this document were discussed and addressed.  No physical exam could be performed with this format.  Please refer to the patient's chart for his  consent to telehealth for [facility].   The patient was identified using 2 identifiers.  Date:  09/17/2019   ID:  Louanna Raw, DOB 1951/05/27, MRN [id number]  Patient Location: Home Provider Location: Home  PCP:  Sueanne Margarita, DO  Cardiologist:  Quay Burow, MD  Electrophysiologist:  None   Evaluation Performed:  Follow-Up Visit  Chief Complaint:  none  History of Present Illness:    [place] Griffin is a 69 y.o. male with a history of CAD.  He was referred to Dr Gwenlyn Found 11/01/17 for exertional chest pain and dyspnea. The pt has multiple cardiac risk factors and his symptoms were typical for exertional angina. OP cath was arranged and this revealed mCFX disease of 95% which was stented. He had no other CAD and had normal LVF. He had a Myoview 12/27/2017 and this was low risk.  He was contacted today for routine follow up. He was a patient of dr Rolly Salter and his care has been transferred to Dr Francesco Sor at [facility].  The patient tells me his PCP follows his labs and that they looked good a few months ago. From a cardiac standpoint the patient feels like he has been doing well.  He walks his dog twice a day.   Sometimes he gets a little tired but he denies chest pain.   The patient does not have symptoms concerning for COVID-19 infection (fever, chills, cough, or new shortness of breath).    Past Medical History:  Diagnosis Date  . Anxiety   . Coronary artery disease   . GERD (gastroesophageal reflux disease)   . High cholesterol   . Type II diabetes mellitus ([place])    Past Surgical History:  Procedure Laterality Date  . CATARACT EXTRACTION W/ INTRAOCULAR LENS  IMPLANT, BILATERAL Bilateral   . CORONARY ANGIOPLASTY WITH STENT PLACEMENT  11/09/2017  . CORONARY STENT INTERVENTION N/A 11/09/2017   Procedure: CORONARY STENT INTERVENTION;  Surgeon: Lorretta Harp, MD;  Location: [place] [facility];  Service: Cardiovascular;  Laterality: N/A;  . EYE SURGERY    . FRACTURE SURGERY    . KNEE ARTHROSCOPY Left   . LEFT HEART CATH AND CORONARY ANGIOGRAPHY N/A 11/09/2017   Procedure: LEFT HEART CATH AND CORONARY ANGIOGRAPHY;  Surgeon: Lorretta Harp, MD;  Location: [place] [facility];  Service: Cardiovascular;  Laterality: N/A;  . ORIF SHOULDER FRACTURE Left ~ 1983  . PARS PLANA VITRECTOMY W/ REPAIR OF MACULAR HOLE Bilateral      Current Meds  Medication Sig  . ALPRAZolam (XANAX) 0.25 MG tablet Take 0.25 mg by mouth 3 (three) times daily.   . Artificial Tear Ointment (DRY EYES OP) Place 1 drop into both eyes daily as needed (for dry eyes).  Marland Kitchen atorvastatin (LIPITOR) 80  MG tablet Take 80 mg by mouth daily.  . clopidogrel (PLAVIX) 75 MG tablet Take 1 tablet (75 mg total) by mouth daily with breakfast.  . cyclobenzaprine (FLEXERIL) 5 MG tablet Take 1 tablet (5 mg total) by mouth at bedtime as needed for muscle spasms.  . LUTEIN PO Take 1 capsule by mouth daily.  . metFORMIN (GLUCOPHAGE) 500 MG tablet Take 1 tablet (500 mg total) by mouth 2 (two) times daily with a meal. Resume in 48 hrs.  . metoprolol succinate (TOPROL-XL) 50 MG 24 hr tablet Take 50 mg by mouth daily.  . nitroGLYCERIN (NITROSTAT)  0.4 MG SL tablet Place 0.4 mg under the tongue every 5 (five) minutes as needed for chest pain.   . pantoprazole (PROTONIX) 40 MG tablet Take 1 tablet (40 mg total) by mouth daily.  . tamsulosin (FLOMAX) 0.4 MG CAPS capsule Take 0.4 mg by mouth daily.     Allergies:   Patient has no known allergies.   Social History   Tobacco Use  . Smoking status: Former Smoker    Packs/day: 1.00    Years: 35.00    Pack years: 35.00    Types: Cigarettes    Quit date: 2007    Years since quitting: 14.3  . Smokeless tobacco: Never Used  Substance Use Topics  . Alcohol use: Not Currently    Comment: 11/09/2017 "nothing in the last 35 years"  . Drug use: Never     Family Hx: The patient's family history includes Diabetes in his sister.  ROS:   Please see the history of present illness.    Iron deficiency anemia- he is due to have a colonoscopy All other systems reviewed and are negative.   Prior CV studies:   The following studies were reviewed today: Myoview July 2019  Labs/Other Tests and Data Reviewed:    EKG:  An ECG dated 12/22/2019 was personally reviewed today and demonstrated:  NSR- HR 85  Recent Labs: No results found for requested labs within last 8760 hours.   Recent Lipid Panel Lab Results  Component Value Date/Time   CHOL 138 11/10/2017 07:20 AM   TRIG 245 (H) 11/10/2017 07:20 AM   HDL 31 (L) 11/10/2017 07:20 AM   CHOLHDL 4.5 11/10/2017 07:20 AM   LDLCALC 58 11/10/2017 07:20 AM    Wt Readings from Last 3 Encounters:  09/17/19 211 lb (95.7 kg)  04/16/18 218 lb 4.1 oz (99 kg)  04/05/18 217 lb 13 oz (98.8 kg)     Objective:    Vital Signs:  Ht 5\' 7"  (1.702 m)   Wt 211 lb (95.7 kg)   BMI 33.05 kg/m    VITAL SIGNS:  reviewed (Nov 2018)  ASSESSMENT & PLAN:    CAD S/P percutaneous coronary angioplasty CFX PCI with DES 11/09/17 No other CAD, normal LVF Myoview (for chest pain) July 2019 was negative  Dyslipidemia LDL 59, HDL 31, trig 245- he was on high  dose prior to cath- No change- PCP follows  Diabetes mellitus type 2, noninsulin dependent (HCC) On Metformin  Essential hypertension Controlled at last visit  Family history of heart disease 2 brothers with CAD  Fe deficiency anemia- PCP following  Plan: F/U Dr Gwenlyn Found in one year  COVID-19 Education: The signs and symptoms of COVID-19 were discussed with the patient and how to seek care for testing (follow up with PCP or arrange E-visit).  The importance of social distancing was discussed today.  Time:   Today, I have spent  15 minutes with the patient with telehealth technology discussing the above problems.     Medication Adjustments/Labs and Tests Ordered: Current medicines are reviewed at length with the patient today.  Concerns regarding medicines are outlined above.   Tests Ordered: No orders of the defined types were placed in this encounter.   Medication Changes: No orders of the defined types were placed in this encounter.   Follow Up:  In Person Dr Gwenlyn Found in one year  Signed, Kerin Ransom, Hershal Coria  09/17/2019 [place]

## 2019-09-17 NOTE — Patient Instructions (Signed)

## 2019-10-21 DIAGNOSIS — D509 Iron deficiency anemia, unspecified: Secondary | ICD-10-CM | POA: Diagnosis not present

## 2019-10-21 DIAGNOSIS — Z95828 Presence of other vascular implants and grafts: Secondary | ICD-10-CM | POA: Diagnosis not present

## 2019-10-21 DIAGNOSIS — I10 Essential (primary) hypertension: Secondary | ICD-10-CM | POA: Diagnosis not present

## 2019-10-21 DIAGNOSIS — R195 Other fecal abnormalities: Secondary | ICD-10-CM | POA: Diagnosis not present

## 2019-10-21 DIAGNOSIS — E1169 Type 2 diabetes mellitus with other specified complication: Secondary | ICD-10-CM | POA: Diagnosis not present

## 2019-10-21 DIAGNOSIS — K219 Gastro-esophageal reflux disease without esophagitis: Secondary | ICD-10-CM | POA: Diagnosis not present

## 2019-10-21 DIAGNOSIS — R82998 Other abnormal findings in urine: Secondary | ICD-10-CM | POA: Diagnosis not present

## 2019-10-22 ENCOUNTER — Telehealth: Payer: Self-pay | Admitting: Cardiovascular Disease

## 2019-10-22 NOTE — Telephone Encounter (Signed)
Connor Wright, verbalized understanding

## 2019-10-22 NOTE — Telephone Encounter (Signed)
Yes that would be appropriate.

## 2019-10-22 NOTE — Telephone Encounter (Signed)
Spoke with Clifton Custard at [facility] wanting to know if ok for patient to take Lisinopril 10 mg daily for blood pressure consistently running 140's/80's. BUN 14 Cr 0.8 Will forward to Pharm D for review

## 2019-12-10 ENCOUNTER — Ambulatory Visit: Payer: PPO | Admitting: Dermatology

## 2019-12-10 ENCOUNTER — Encounter: Payer: Self-pay | Admitting: Dermatology

## 2019-12-10 ENCOUNTER — Other Ambulatory Visit: Payer: Self-pay

## 2019-12-10 DIAGNOSIS — L821 Other seborrheic keratosis: Secondary | ICD-10-CM | POA: Diagnosis not present

## 2019-12-10 DIAGNOSIS — Z1283 Encounter for screening for malignant neoplasm of skin: Secondary | ICD-10-CM

## 2019-12-10 DIAGNOSIS — L738 Other specified follicular disorders: Secondary | ICD-10-CM | POA: Diagnosis not present

## 2019-12-10 DIAGNOSIS — D225 Melanocytic nevi of trunk: Secondary | ICD-10-CM | POA: Diagnosis not present

## 2019-12-10 DIAGNOSIS — D1801 Hemangioma of skin and subcutaneous tissue: Secondary | ICD-10-CM | POA: Diagnosis not present

## 2019-12-10 DIAGNOSIS — D229 Melanocytic nevi, unspecified: Secondary | ICD-10-CM

## 2019-12-10 NOTE — Patient Instructions (Addendum)
General skin examination for the first time in years for Connor Wright.  There is no sign of atypical mole or melanoma or skin cancer from his legs to the top of his head.  He was most concerned about rough spots on his left outer back, front of neck and sternum; all of these are benign seborrheic keratoses which he also has on the back of his legs and on the forehead.  These do not require freezing or biopsy or special watching.  In the middle of his lower forehead is a 1 cm subtle flesh-colored thickening that represents sebaceous gland hyperplasia and can be left if stable.  On his torso are multiple smooth red dots call cherry angiomas which are quite harmless; on rub areas Connor has several harmless skin tags.  He knows that I will check any area that he thinks is changing, otherwise I will be happy to do a general skin check every 1 to 3 years.

## 2019-12-25 DIAGNOSIS — K921 Melena: Secondary | ICD-10-CM | POA: Diagnosis not present

## 2019-12-25 DIAGNOSIS — E611 Iron deficiency: Secondary | ICD-10-CM | POA: Diagnosis not present

## 2019-12-27 ENCOUNTER — Telehealth: Payer: Self-pay | Admitting: *Deleted

## 2019-12-27 NOTE — Telephone Encounter (Signed)
Okay to interrupt antiplatelet therapy including Plavix for colonoscopy.

## 2019-12-27 NOTE — Telephone Encounter (Signed)
   Primary Cardiologist: Quay Burow, MD  Chart reviewed as part of pre-operative protocol coverage. Given past medical history and time since last visit, based on ACC/AHA guidelines, Connor Wright would be at acceptable risk for the planned procedure without further cardiovascular testing.   He may hold his Plavix for 5 days prior to the procedure.  Please resume as soon as hemostasis is achieved.  I will route this recommendation to the requesting party via Epic fax function and remove from pre-op pool.  Please call with questions.  Jossie Ng. Salmaan Patchin NP-C    12/27/2019, 4:48 PM [facility] [address] [address] Office [phone] Fax [phone]

## 2019-12-27 NOTE — Telephone Encounter (Signed)
° °  [facility] Pre-operative Risk Assessment    HEARTCARE STAFF: - Please ensure there is not already an duplicate clearance open for this procedure. - Under Visit Info/Reason for Call, type in Other and utilize the format Clearance MM/DD/YY or Clearance TBD. Do not use dashes or single digits. - If request is for dental extraction, please clarify the # of teeth to be extracted.  Request for surgical clearance:  1. What type of surgery is being performed? Colonoscopy/endoscopy  2. When is this surgery scheduled? 02/18/20   3. What type of clearance is required (medical clearance vs. Pharmacy clearance to hold med vs. Both)? both  4. Are there any medications that need to be held prior to surgery and how long?plavix for 5 days prior   5. Practice name and name of physician performing surgery? [facility]  6. What is the office phone number? [phone]   7.   What is the office fax number? [phone]  8.   Anesthesia type (None, local, MAC, general) ? unknown   Fredia Beets 12/27/2019, 2:56 PM  _________________________________________________________________   (provider comments below)

## 2019-12-27 NOTE — Telephone Encounter (Signed)
Zian Delair 69 year old male was last seen via virtual platform by Kerin Ransom, PA-C on 09/17/2019.  During that time he was doing well and had no complaints of chest pain.  He is requesting colonoscopy/EGD.  May his Plavix be held prior to the procedure?  His PMH includes coronary artery disease.  He underwent outpatient cardiac catheterization which revealed M CFX disease of 95% which was stented.  No other CAD was observed and LVEF was normal at that time.  He underwent Myoview on 7/19 which was low risk.  Please direct your response to CV DIV preop pool.  Thank you for your help.  Jossie Ng. Janielle Mittelstadt NP-C    12/27/2019, 3:26 PM [place] [address] Office [phone] Fax [phone]

## 2020-01-04 ENCOUNTER — Encounter: Payer: Self-pay | Admitting: Dermatology

## 2020-01-04 NOTE — Progress Notes (Addendum)
   New Patient   Subjective  Connor Wright is a 69 y.o. male who presents for the following: New Patient (Initial Visit) (Patient here today for skin check.  Per patient he has a few spots he'd like you to look at on his chest, and neck no bleeding.).  General skin check Location: Larger spots chest, front neck, back Duration:  Quality:  Associated Signs/Symptoms: Modifying Factors:  Severity:  Timing: Context:    The following portions of the chart were reviewed this encounter and updated as appropriate: Tobacco  Allergies  Meds  Problems  Med Hx  Surg Hx  Fam Hx      Objective  Well appearing patient in no apparent distress; mood and affect are within normal limits.  A full examination was performed including scalp, head, eyes, ears, nose, lips, neck, chest, axillae, abdomen, back, buttocks, bilateral upper extremities, bilateral lower extremities, hands, feet, fingers, toes, fingernails, and toenails. All findings within normal limits unless otherwise noted below.   Assessment & Plan  Nevus Mid Back  General skin examination every 1 to 3 years  Seborrheic keratosis (3) Neck - Anterior; Chest - Medial [facility]); Left Upper Back  Leave if stable  Skin cancer screening performed today. General skin examination for the first time in years for Connor Wright.  There is no sign of atypical mole or melanoma or skin cancer from his legs to the top of his head.  He was most concerned about rough spots on his left outer back, front of neck and sternum; all of these are benign seborrheic keratoses which he also has on the back of his legs and on the forehead.  These do not require freezing or biopsy or special watching.  In the middle of his lower forehead is a 1 cm subtle flesh-colored thickening that represents sebaceous gland hyperplasia and can be left if stable.  On his torso are multiple smooth red dots call cherry angiomas which are quite harmless; on rub areas  Connor has several harmless skin tags.  He knows that I will check any area that he thinks is changing, otherwise I will be happy to do a general skin check every 1 to 3 years.

## 2020-03-26 DIAGNOSIS — Z1159 Encounter for screening for other viral diseases: Secondary | ICD-10-CM | POA: Diagnosis not present

## 2020-03-31 DIAGNOSIS — D509 Iron deficiency anemia, unspecified: Secondary | ICD-10-CM | POA: Diagnosis not present

## 2020-03-31 DIAGNOSIS — B9681 Helicobacter pylori [H. pylori] as the cause of diseases classified elsewhere: Secondary | ICD-10-CM | POA: Diagnosis not present

## 2020-03-31 DIAGNOSIS — K635 Polyp of colon: Secondary | ICD-10-CM | POA: Diagnosis not present

## 2020-03-31 DIAGNOSIS — K648 Other hemorrhoids: Secondary | ICD-10-CM | POA: Diagnosis not present

## 2020-03-31 DIAGNOSIS — R195 Other fecal abnormalities: Secondary | ICD-10-CM | POA: Diagnosis not present

## 2020-03-31 DIAGNOSIS — D122 Benign neoplasm of ascending colon: Secondary | ICD-10-CM | POA: Diagnosis not present

## 2020-03-31 DIAGNOSIS — K2930 Chronic superficial gastritis without bleeding: Secondary | ICD-10-CM | POA: Diagnosis not present

## 2020-03-31 DIAGNOSIS — K5730 Diverticulosis of large intestine without perforation or abscess without bleeding: Secondary | ICD-10-CM | POA: Diagnosis not present

## 2020-03-31 DIAGNOSIS — D123 Benign neoplasm of transverse colon: Secondary | ICD-10-CM | POA: Diagnosis not present

## 2020-04-06 DIAGNOSIS — K2930 Chronic superficial gastritis without bleeding: Secondary | ICD-10-CM | POA: Diagnosis not present

## 2020-04-06 DIAGNOSIS — K635 Polyp of colon: Secondary | ICD-10-CM | POA: Diagnosis not present

## 2020-04-06 DIAGNOSIS — B9681 Helicobacter pylori [H. pylori] as the cause of diseases classified elsewhere: Secondary | ICD-10-CM | POA: Diagnosis not present

## 2020-04-06 DIAGNOSIS — D123 Benign neoplasm of transverse colon: Secondary | ICD-10-CM | POA: Diagnosis not present

## 2020-04-06 DIAGNOSIS — D122 Benign neoplasm of ascending colon: Secondary | ICD-10-CM | POA: Diagnosis not present

## 2020-05-19 DIAGNOSIS — E785 Hyperlipidemia, unspecified: Secondary | ICD-10-CM | POA: Diagnosis not present

## 2020-05-19 DIAGNOSIS — Z125 Encounter for screening for malignant neoplasm of prostate: Secondary | ICD-10-CM | POA: Diagnosis not present

## 2020-05-19 DIAGNOSIS — E1169 Type 2 diabetes mellitus with other specified complication: Secondary | ICD-10-CM | POA: Diagnosis not present

## 2020-05-26 DIAGNOSIS — Z1331 Encounter for screening for depression: Secondary | ICD-10-CM | POA: Diagnosis not present

## 2020-05-26 DIAGNOSIS — F419 Anxiety disorder, unspecified: Secondary | ICD-10-CM | POA: Diagnosis not present

## 2020-05-26 DIAGNOSIS — Z1339 Encounter for screening examination for other mental health and behavioral disorders: Secondary | ICD-10-CM | POA: Diagnosis not present

## 2020-05-26 DIAGNOSIS — N401 Enlarged prostate with lower urinary tract symptoms: Secondary | ICD-10-CM | POA: Diagnosis not present

## 2020-05-26 DIAGNOSIS — R82998 Other abnormal findings in urine: Secondary | ICD-10-CM | POA: Diagnosis not present

## 2020-05-26 DIAGNOSIS — E1169 Type 2 diabetes mellitus with other specified complication: Secondary | ICD-10-CM | POA: Diagnosis not present

## 2020-05-26 DIAGNOSIS — Z0000 Encounter for general adult medical examination without abnormal findings: Secondary | ICD-10-CM | POA: Diagnosis not present

## 2020-05-26 DIAGNOSIS — D229 Melanocytic nevi, unspecified: Secondary | ICD-10-CM | POA: Diagnosis not present

## 2020-05-26 DIAGNOSIS — E785 Hyperlipidemia, unspecified: Secondary | ICD-10-CM | POA: Diagnosis not present

## 2020-05-26 DIAGNOSIS — D509 Iron deficiency anemia, unspecified: Secondary | ICD-10-CM | POA: Diagnosis not present

## 2020-05-26 DIAGNOSIS — I2510 Atherosclerotic heart disease of native coronary artery without angina pectoris: Secondary | ICD-10-CM | POA: Diagnosis not present

## 2020-05-26 DIAGNOSIS — I10 Essential (primary) hypertension: Secondary | ICD-10-CM | POA: Diagnosis not present

## 2020-06-08 DIAGNOSIS — R197 Diarrhea, unspecified: Secondary | ICD-10-CM | POA: Diagnosis not present

## 2020-07-07 DIAGNOSIS — Z961 Presence of intraocular lens: Secondary | ICD-10-CM | POA: Diagnosis not present

## 2020-07-07 DIAGNOSIS — E119 Type 2 diabetes mellitus without complications: Secondary | ICD-10-CM | POA: Diagnosis not present

## 2020-07-07 DIAGNOSIS — H524 Presbyopia: Secondary | ICD-10-CM | POA: Diagnosis not present

## 2020-08-28 DIAGNOSIS — I2510 Atherosclerotic heart disease of native coronary artery without angina pectoris: Secondary | ICD-10-CM | POA: Diagnosis not present

## 2020-08-28 DIAGNOSIS — N401 Enlarged prostate with lower urinary tract symptoms: Secondary | ICD-10-CM | POA: Diagnosis not present

## 2020-08-28 DIAGNOSIS — K219 Gastro-esophageal reflux disease without esophagitis: Secondary | ICD-10-CM | POA: Diagnosis not present

## 2020-08-28 DIAGNOSIS — I10 Essential (primary) hypertension: Secondary | ICD-10-CM | POA: Diagnosis not present

## 2020-08-28 DIAGNOSIS — F419 Anxiety disorder, unspecified: Secondary | ICD-10-CM | POA: Diagnosis not present

## 2020-08-28 DIAGNOSIS — Z8619 Personal history of other infectious and parasitic diseases: Secondary | ICD-10-CM | POA: Diagnosis not present

## 2020-08-28 DIAGNOSIS — D509 Iron deficiency anemia, unspecified: Secondary | ICD-10-CM | POA: Diagnosis not present

## 2020-08-28 DIAGNOSIS — E785 Hyperlipidemia, unspecified: Secondary | ICD-10-CM | POA: Diagnosis not present

## 2020-08-28 DIAGNOSIS — E1169 Type 2 diabetes mellitus with other specified complication: Secondary | ICD-10-CM | POA: Diagnosis not present

## 2020-09-17 ENCOUNTER — Other Ambulatory Visit (HOSPITAL_COMMUNITY): Payer: Self-pay

## 2020-09-18 ENCOUNTER — Encounter (HOSPITAL_COMMUNITY)
Admission: RE | Admit: 2020-09-18 | Discharge: 2020-09-18 | Disposition: A | Discharge status: Home / Self Care | Payer: PPO | Source: Ambulatory Visit | Attending: Internal Medicine | Admitting: Internal Medicine

## 2020-09-18 ENCOUNTER — Encounter: Payer: Self-pay | Admitting: Cardiovascular Disease

## 2020-09-18 ENCOUNTER — Other Ambulatory Visit: Payer: Self-pay

## 2020-09-18 ENCOUNTER — Ambulatory Visit: Payer: PPO | Admitting: Cardiovascular Disease

## 2020-09-18 VITALS — BP 146/86 | HR 97 | Ht 66.5 in | Wt 226.0 lb

## 2020-09-18 DIAGNOSIS — E785 Hyperlipidemia, unspecified: Secondary | ICD-10-CM

## 2020-09-18 DIAGNOSIS — I2510 Atherosclerotic heart disease of native coronary artery without angina pectoris: Secondary | ICD-10-CM

## 2020-09-18 DIAGNOSIS — I10 Essential (primary) hypertension: Secondary | ICD-10-CM | POA: Diagnosis not present

## 2020-09-18 DIAGNOSIS — D649 Anemia, unspecified: Secondary | ICD-10-CM | POA: Diagnosis not present

## 2020-09-18 DIAGNOSIS — Z9861 Coronary angioplasty status: Secondary | ICD-10-CM

## 2020-09-18 MED ORDER — SODIUM CHLORIDE 0.9 % IV SOLN
510.0000 mg | INTRAVENOUS | Status: DC
  Administered 2020-09-18: 510 mg via INTRAVENOUS
  Filled 2020-09-18: qty 510

## 2020-09-18 NOTE — Progress Notes (Addendum)
07/20/2021 Connor Wright   Sep 07, 1950  [id number]  Primary Physician Sueanne Margarita, DO Primary Cardiologist: Lorretta Harp MD [name], [place]  HPI:  Connor Wright is a 70 y.o.     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  He was referred by Dr. Nyoka Cowden for cardiovascular evaluation because of new onset dyspnea and chest pressure.  I last saw him in the office 03/27/2018. Risk factors include 40 pack years of tobacco abuse having quit 12 years ago, treated diabetes and hyperlipidemia as well as family history of heart disease with 2 brothers who had stents and bypass surgery.  He is never had a heart attack or stroke.  He is noticed increasing dyspnea on exertion and chest pressure with walking up a hill in the last several weeks which sounds fairly typical for angina. Because of his fairly typical symptoms I elected to proceed directly to outpatient radial diagnostic coronary angiography on 11/09/2017 which revealed a high-grade mid AV groove circumflex stenosis which I stented with a 3.5 x 16 mm long Synergy drug-eluting stent postdilated to 3.9 mm.  His symptoms resolved.    Since I saw him in the office 2 years ago he is remained stable.  His major issue was persistent anemia currently getting iron infusions.  His most recent hemoglobin was in the 9 range.  He does complain of some mild dyspnea when walking up a hill probably related to this but denies chest pain.  Current Meds  Medication Sig   ALPRAZolam (XANAX) 0.25 MG tablet Take 0.25 mg by mouth 3 (three) times daily.   Artificial Tear Ointment (DRY EYES OP) Place 1 drop into both eyes daily as needed (for dry eyes).   atorvastatin (LIPITOR) 80 MG tablet Take 80 mg by mouth daily.   clopidogrel (PLAVIX) 75 MG tablet Take 1 tablet (75 mg total) by mouth daily with breakfast.   FEROSUL 325 (65 Fe) MG tablet Take 325 mg by mouth daily.   LUTEIN PO Take 1 capsule  by mouth daily.   metFORMIN (GLUCOPHAGE) 500 MG tablet Take 1 tablet (500 mg total) by mouth 2 (two) times daily with a meal. Resume in 48 hrs.   metoprolol succinate (TOPROL-XL) 50 MG 24 hr tablet Take 50 mg by mouth daily.   nitroGLYCERIN (NITROSTAT) 0.4 MG SL tablet Place 0.4 mg under the tongue every 5 (five) minutes as needed for chest pain.    pantoprazole (PROTONIX) 40 MG tablet Take 1 tablet (40 mg total) by mouth daily.   tadalafil (CIALIS) 5 MG tablet    tamsulosin (FLOMAX) 0.4 MG CAPS capsule Take 0.4 mg by mouth daily.     No Known Allergies  Social History   Socioeconomic History   Marital status: Married    Spouse name: Not on file   Number of children: Not on file   Years of education: Not on file   Highest education level: Not on file  Occupational History   Not on file  Tobacco Use   Smoking status: Former    Packs/day: 1.00    Years: 35.00    Pack years: 35.00    Types: Cigarettes    Quit date: 2007    Years since quitting: 16.1   Smokeless tobacco: Never  Vaping Use   Vaping Use: Never used  Substance and Sexual Activity   Alcohol use: Not Currently    Comment: 11/09/2017 "nothing in the last 35 years"  Drug use: Never   Sexual activity: Yes  Other Topics Concern   Not on file  Social History Narrative   Not on file   Social Determinants of Health   Financial Resource Strain: Not on file  Food Insecurity: Not on file  Transportation Needs: Not on file  Physical Activity: Not on file  Stress: Not on file  Social Connections: Not on file  Intimate Partner Violence: Not on file     Review of Systems: General: negative for chills, fever, night sweats or weight changes.  Cardiovascular: negative for chest pain, dyspnea on exertion, edema, orthopnea, palpitations, paroxysmal nocturnal dyspnea or shortness of breath Dermatological: negative for rash Respiratory: negative for cough or wheezing Urologic: negative for hematuria Abdominal: negative  for nausea, vomiting, diarrhea, bright red blood per rectum, melena, or hematemesis Neurologic: negative for visual changes, syncope, or dizziness All other systems reviewed and are otherwise negative except as noted above.    Blood pressure (!) 146/86, pulse 97, height 5' 6.5" (1.689 m), weight 226 lb (102.5 kg).  General appearance: alert and no distress Neck: no adenopathy, no carotid bruit, no JVD, supple, symmetrical, trachea midline and thyroid not enlarged, symmetric, no tenderness/mass/nodules Lungs: clear to auscultation bilaterally Heart: regular rate and rhythm, S1, S2 normal, no murmur, click, rub or gallop Extremities: extremities normal, atraumatic, no cyanosis or edema Pulses: 2+ and symmetric Skin: Skin color, texture, turgor normal. No rashes or lesions Neurologic: Alert and oriented X 3, normal strength and tone. Normal symmetric reflexes. Normal coordination and gait  EKG sinus rhythm at 97 without ST or T wave changes.  I personally reviewed this EKG.  ASSESSMENT AND PLAN:   Essential hypertension History of essential hypertension blood pressure measured today at 146/86.  He is on metoprolol.  Dyslipidemia History of hyperlipidemia on high-dose atorvastatin with lipid profile performed 05/19/2020 revealing total cholesterol of 162, LDL of 74 and HDL of 39.  Addendum: His most recent lipid profile performed 06/10/2021 revealed total cholesterol of 172, LDL of 86 and HDL 34.  CAD S/P percutaneous coronary angioplasty History of CAD status post mid AV groove circumflex PCI and drug-eluting stenting by myself performed radially 11/09/2017 with a 3.5 mm x 16 mm long Synergy drug-eluting stent postdilated to 3.9 mm.  He had no other significant CAD.  He has been asymptomatic.      Lorretta Harp MD FACP,FACC,FAHA, [facility] 07/20/2021 4:15 PM

## 2020-09-18 NOTE — Assessment & Plan Note (Signed)
History of CAD status post mid AV groove circumflex PCI and drug-eluting stenting by myself performed radially 11/09/2017 with a 3.5 mm x 16 mm long Synergy drug-eluting stent postdilated to 3.9 mm.  He had no other significant CAD.  He has been asymptomatic.

## 2020-09-18 NOTE — Discharge Instructions (Signed)
Ferumoxytol injection What is this medicine? FERUMOXYTOL is an iron complex. Iron is used to make healthy red blood cells, which carry oxygen and nutrients throughout the body. This medicine is used to treat iron deficiency anemia. This medicine may be used for other purposes; ask your health care provider or pharmacist if you have questions. COMMON BRAND NAME(S): Feraheme What should I tell my health care provider before I take this medicine? They need to know if you have any of these conditions:  anemia not caused by low iron levels  high levels of iron in the blood  magnetic resonance imaging (MRI) test scheduled  an unusual or allergic reaction to iron, other medicines, foods, dyes, or preservatives  pregnant or trying to get pregnant  breast-feeding How should I use this medicine? This medicine is for injection into a vein. It is given by a health care professional in a hospital or clinic setting. Talk to your pediatrician regarding the use of this medicine in children. Special care may be needed. Overdosage: If you think you have taken too much of this medicine contact a poison control center or emergency room at once. NOTE: This medicine is only for you. Do not share this medicine with others. What if I miss a dose? It is important not to miss your dose. Call your doctor or health care professional if you are unable to keep an appointment. What may interact with this medicine? This medicine may interact with the following medications:  other iron products This list may not describe all possible interactions. Give your health care provider a list of all the medicines, herbs, non-prescription drugs, or dietary supplements you use. Also tell them if you smoke, drink alcohol, or use illegal drugs. Some items may interact with your medicine. What should I watch for while using this medicine? Visit your doctor or healthcare professional regularly. Tell your doctor or healthcare  professional if your symptoms do not start to get better or if they get worse. You may need blood work done while you are taking this medicine. You may need to follow a special diet. Talk to your doctor. Foods that contain iron include: whole grains/cereals, dried fruits, beans, or peas, leafy green vegetables, and organ meats (liver, kidney). What side effects may I notice from receiving this medicine? Side effects that you should report to your doctor or health care professional as soon as possible:  allergic reactions like skin rash, itching or hives, swelling of the face, lips, or tongue  breathing problems  changes in blood pressure  feeling faint or lightheaded, falls  fever or chills  flushing, sweating, or hot feelings  swelling of the ankles or feet Side effects that usually do not require medical attention (report to your doctor or health care professional if they continue or are bothersome):  diarrhea  headache  nausea, vomiting  stomach pain This list may not describe all possible side effects. Call your doctor for medical advice about side effects. You may report side effects to FDA at 1-800-FDA-1088. Where should I keep my medicine? This drug is given in a hospital or clinic and will not be stored at home. NOTE: This sheet is a summary. It may not cover all possible information. If you have questions about this medicine, talk to your doctor, pharmacist, or health care provider.  2021 Elsevier/Gold Standard (2016-07-04 20:21:10)  

## 2020-09-18 NOTE — Assessment & Plan Note (Addendum)
History of hyperlipidemia on high-dose atorvastatin with lipid profile performed 05/19/2020 revealing total cholesterol of 162, LDL of 74 and HDL of 39.  Addendum: His most recent lipid profile performed 06/10/2021 revealed total cholesterol of 172, LDL of 86 and HDL 34.

## 2020-09-18 NOTE — Assessment & Plan Note (Signed)
History of essential hypertension blood pressure measured today at 146/86.  He is on metoprolol. 

## 2020-09-18 NOTE — Patient Instructions (Signed)

## 2020-09-25 ENCOUNTER — Other Ambulatory Visit: Payer: Self-pay

## 2020-09-25 ENCOUNTER — Ambulatory Visit (HOSPITAL_COMMUNITY)
Admission: RE | Admit: 2020-09-25 | Discharge: 2020-09-25 | Disposition: A | Discharge status: Home / Self Care | Payer: PPO | Source: Ambulatory Visit | Attending: Internal Medicine | Admitting: Internal Medicine

## 2020-09-25 DIAGNOSIS — D649 Anemia, unspecified: Secondary | ICD-10-CM | POA: Diagnosis not present

## 2020-09-25 MED ORDER — SODIUM CHLORIDE 0.9 % IV SOLN
510.0000 mg | INTRAVENOUS | Status: AC
  Administered 2020-09-25: 510 mg via INTRAVENOUS
  Filled 2020-09-25: qty 510

## 2020-10-20 DIAGNOSIS — H40023 Open angle with borderline findings, high risk, bilateral: Secondary | ICD-10-CM | POA: Diagnosis not present

## 2020-10-20 DIAGNOSIS — E119 Type 2 diabetes mellitus without complications: Secondary | ICD-10-CM | POA: Diagnosis not present

## 2020-11-26 DIAGNOSIS — F419 Anxiety disorder, unspecified: Secondary | ICD-10-CM | POA: Diagnosis not present

## 2020-11-26 DIAGNOSIS — E1169 Type 2 diabetes mellitus with other specified complication: Secondary | ICD-10-CM | POA: Diagnosis not present

## 2020-11-26 DIAGNOSIS — M9905 Segmental and somatic dysfunction of pelvic region: Secondary | ICD-10-CM | POA: Diagnosis not present

## 2020-11-26 DIAGNOSIS — M9903 Segmental and somatic dysfunction of lumbar region: Secondary | ICD-10-CM | POA: Diagnosis not present

## 2020-11-26 DIAGNOSIS — E785 Hyperlipidemia, unspecified: Secondary | ICD-10-CM | POA: Diagnosis not present

## 2020-11-26 DIAGNOSIS — I2510 Atherosclerotic heart disease of native coronary artery without angina pectoris: Secondary | ICD-10-CM | POA: Diagnosis not present

## 2020-11-26 DIAGNOSIS — I10 Essential (primary) hypertension: Secondary | ICD-10-CM | POA: Diagnosis not present

## 2020-11-26 DIAGNOSIS — M9904 Segmental and somatic dysfunction of sacral region: Secondary | ICD-10-CM | POA: Diagnosis not present

## 2020-11-26 DIAGNOSIS — D509 Iron deficiency anemia, unspecified: Secondary | ICD-10-CM | POA: Diagnosis not present

## 2020-11-26 DIAGNOSIS — M9902 Segmental and somatic dysfunction of thoracic region: Secondary | ICD-10-CM | POA: Diagnosis not present

## 2020-11-26 DIAGNOSIS — M9906 Segmental and somatic dysfunction of lower extremity: Secondary | ICD-10-CM | POA: Diagnosis not present

## 2020-12-09 ENCOUNTER — Ambulatory Visit: Payer: PPO | Admitting: Dermatology

## 2020-12-09 ENCOUNTER — Other Ambulatory Visit: Payer: Self-pay

## 2020-12-09 DIAGNOSIS — Z1283 Encounter for screening for malignant neoplasm of skin: Secondary | ICD-10-CM

## 2020-12-09 DIAGNOSIS — L821 Other seborrheic keratosis: Secondary | ICD-10-CM

## 2020-12-09 DIAGNOSIS — D1800 Hemangioma unspecified site: Secondary | ICD-10-CM

## 2020-12-09 DIAGNOSIS — L738 Other specified follicular disorders: Secondary | ICD-10-CM | POA: Diagnosis not present

## 2020-12-20 ENCOUNTER — Encounter: Payer: Self-pay | Admitting: Dermatology

## 2020-12-20 NOTE — Progress Notes (Signed)
   Follow-Up Visit   Subjective  Connor Wright is a 70 y.o. male who presents for the following: Annual Exam (Here for yearly skin exam. No concerns. ).  General skin examination Location:  Duration:  Quality:  Associated Signs/Symptoms: Modifying Factors:  Severity:  Timing: Context:   Objective  Well appearing patient in no apparent distress; mood and affect are within normal limits. No atypical pigmented lesions or nonmelanoma skin cancer  Mid Forehead Two millimeter eccentrically dimpled flesh-colored papule  Left Abdomen (side) - Upper, Right Abdomen (side) - Upper Multiple 1 to 2 mm smooth red dermal papules    A full examination was performed including scalp, head, eyes, ears, nose, lips, neck, chest, axillae, abdomen, back, buttocks, bilateral upper extremities, bilateral lower extremities, hands, feet, fingers, toes, fingernails, and toenails. All findings within normal limits unless otherwise noted below.   Assessment & Plan    Seborrheic keratosis Right Forehead  Sebaceous hyperplasia of face Mid Forehead  Told of similar appearance of early BCC so if there is growth or bleeding return for biopsy.  Hemangioma, unspecified site (2) Left Abdomen (side) - Upper; Right Abdomen (side) - Upper  No intervention necessary  Encounter for screening for malignant neoplasm of skin  Annual skin examination.      I, Lavonna Monarch, MD, have reviewed all documentation for this visit.  The documentation on 12/20/20 for the exam, diagnosis, procedures, and orders are all accurate and complete.

## 2021-03-02 DIAGNOSIS — H40023 Open angle with borderline findings, high risk, bilateral: Secondary | ICD-10-CM | POA: Diagnosis not present

## 2021-06-10 DIAGNOSIS — E1169 Type 2 diabetes mellitus with other specified complication: Secondary | ICD-10-CM | POA: Diagnosis not present

## 2021-06-10 DIAGNOSIS — I10 Essential (primary) hypertension: Secondary | ICD-10-CM | POA: Diagnosis not present

## 2021-06-10 DIAGNOSIS — E119 Type 2 diabetes mellitus without complications: Secondary | ICD-10-CM | POA: Diagnosis not present

## 2021-06-10 DIAGNOSIS — Z125 Encounter for screening for malignant neoplasm of prostate: Secondary | ICD-10-CM | POA: Diagnosis not present

## 2021-06-10 DIAGNOSIS — E785 Hyperlipidemia, unspecified: Secondary | ICD-10-CM | POA: Diagnosis not present

## 2021-06-17 DIAGNOSIS — N401 Enlarged prostate with lower urinary tract symptoms: Secondary | ICD-10-CM | POA: Diagnosis not present

## 2021-06-17 DIAGNOSIS — E785 Hyperlipidemia, unspecified: Secondary | ICD-10-CM | POA: Diagnosis not present

## 2021-06-17 DIAGNOSIS — Z1339 Encounter for screening examination for other mental health and behavioral disorders: Secondary | ICD-10-CM | POA: Diagnosis not present

## 2021-06-17 DIAGNOSIS — Z23 Encounter for immunization: Secondary | ICD-10-CM | POA: Diagnosis not present

## 2021-06-17 DIAGNOSIS — I10 Essential (primary) hypertension: Secondary | ICD-10-CM | POA: Diagnosis not present

## 2021-06-17 DIAGNOSIS — Z1331 Encounter for screening for depression: Secondary | ICD-10-CM | POA: Diagnosis not present

## 2021-06-17 DIAGNOSIS — I2510 Atherosclerotic heart disease of native coronary artery without angina pectoris: Secondary | ICD-10-CM | POA: Diagnosis not present

## 2021-06-17 DIAGNOSIS — Z8619 Personal history of other infectious and parasitic diseases: Secondary | ICD-10-CM | POA: Diagnosis not present

## 2021-06-17 DIAGNOSIS — Z6834 Body mass index (BMI) 34.0-34.9, adult: Secondary | ICD-10-CM | POA: Diagnosis not present

## 2021-06-17 DIAGNOSIS — F419 Anxiety disorder, unspecified: Secondary | ICD-10-CM | POA: Diagnosis not present

## 2021-06-17 DIAGNOSIS — D509 Iron deficiency anemia, unspecified: Secondary | ICD-10-CM | POA: Diagnosis not present

## 2021-06-17 DIAGNOSIS — R052 Subacute cough: Secondary | ICD-10-CM | POA: Diagnosis not present

## 2021-06-17 DIAGNOSIS — E1169 Type 2 diabetes mellitus with other specified complication: Secondary | ICD-10-CM | POA: Diagnosis not present

## 2021-08-19 ENCOUNTER — Other Ambulatory Visit (HOSPITAL_BASED_OUTPATIENT_CLINIC_OR_DEPARTMENT_OTHER): Payer: Self-pay

## 2021-08-19 DIAGNOSIS — I2510 Atherosclerotic heart disease of native coronary artery without angina pectoris: Secondary | ICD-10-CM | POA: Diagnosis not present

## 2021-08-19 DIAGNOSIS — I10 Essential (primary) hypertension: Secondary | ICD-10-CM | POA: Diagnosis not present

## 2021-08-19 DIAGNOSIS — E669 Obesity, unspecified: Secondary | ICD-10-CM | POA: Diagnosis not present

## 2021-08-19 DIAGNOSIS — E1169 Type 2 diabetes mellitus with other specified complication: Secondary | ICD-10-CM | POA: Diagnosis not present

## 2021-08-19 MED ORDER — OZEMPIC (1 MG/DOSE) 4 MG/3ML ~~LOC~~ SOPN
PEN_INJECTOR | SUBCUTANEOUS | 6 refills | Status: DC
  Filled 2021-08-19: qty 3, 28d supply, fill #0
  Filled 2021-12-01: qty 3, 28d supply, fill #1
  Filled 2022-01-17: qty 3, 28d supply, fill #2
  Filled 2022-03-07: qty 3, 28d supply, fill #3
  Filled 2022-05-09: qty 3, 28d supply, fill #4
  Filled 2022-07-11: qty 3, 28d supply, fill #5

## 2021-08-30 DIAGNOSIS — I10 Essential (primary) hypertension: Secondary | ICD-10-CM | POA: Diagnosis not present

## 2021-08-30 DIAGNOSIS — E1169 Type 2 diabetes mellitus with other specified complication: Secondary | ICD-10-CM | POA: Diagnosis not present

## 2021-08-30 DIAGNOSIS — H40023 Open angle with borderline findings, high risk, bilateral: Secondary | ICD-10-CM | POA: Diagnosis not present

## 2021-08-30 DIAGNOSIS — E119 Type 2 diabetes mellitus without complications: Secondary | ICD-10-CM | POA: Diagnosis not present

## 2021-11-08 ENCOUNTER — Other Ambulatory Visit (HOSPITAL_BASED_OUTPATIENT_CLINIC_OR_DEPARTMENT_OTHER): Payer: Self-pay

## 2021-11-08 MED ORDER — SEMAGLUTIDE(0.25 OR 0.5MG/DOS) 2 MG/3ML ~~LOC~~ SOPN
PEN_INJECTOR | SUBCUTANEOUS | 1 refills | Status: DC
  Filled 2021-11-08: qty 3, 28d supply, fill #0

## 2021-11-11 ENCOUNTER — Other Ambulatory Visit (HOSPITAL_COMMUNITY): Payer: Self-pay

## 2021-11-23 ENCOUNTER — Other Ambulatory Visit (HOSPITAL_BASED_OUTPATIENT_CLINIC_OR_DEPARTMENT_OTHER): Payer: Self-pay

## 2021-11-23 MED ORDER — VALSARTAN 40 MG PO TABS
ORAL_TABLET | ORAL | 3 refills | Status: DC
  Filled 2021-11-23: qty 90, 90d supply, fill #0
  Filled 2022-02-25: qty 30, 30d supply, fill #1
  Filled 2022-02-25: qty 60, 60d supply, fill #1
  Filled 2022-05-31 (×2): qty 90, 90d supply, fill #2
  Filled 2022-08-30: qty 90, 90d supply, fill #3

## 2021-11-23 MED ORDER — OZEMPIC (1 MG/DOSE) 4 MG/3ML ~~LOC~~ SOPN
PEN_INJECTOR | SUBCUTANEOUS | 6 refills | Status: DC
  Filled 2021-11-23: qty 3, 30d supply, fill #0

## 2021-11-23 MED ORDER — METOPROLOL SUCCINATE ER 50 MG PO TB24
ORAL_TABLET | ORAL | 3 refills | Status: DC
  Filled 2021-11-23: qty 90, 90d supply, fill #0

## 2021-11-23 MED ORDER — TADALAFIL 5 MG PO TABS
ORAL_TABLET | ORAL | 3 refills | Status: DC
  Filled 2021-11-23: qty 90, 90d supply, fill #0
  Filled 2021-12-01: qty 60, 60d supply, fill #0

## 2021-11-23 MED ORDER — CLOPIDOGREL BISULFATE 75 MG PO TABS
ORAL_TABLET | ORAL | 3 refills | Status: DC
  Filled 2021-11-23: qty 90, 90d supply, fill #0
  Filled 2022-02-25: qty 90, 90d supply, fill #1
  Filled 2022-05-31 (×2): qty 90, 90d supply, fill #2
  Filled 2022-08-30: qty 90, 90d supply, fill #3

## 2021-11-23 MED ORDER — TAMSULOSIN HCL 0.4 MG PO CAPS
ORAL_CAPSULE | ORAL | 3 refills | Status: DC
  Filled 2021-11-23: qty 90, 90d supply, fill #0

## 2021-11-23 MED ORDER — ATORVASTATIN CALCIUM 80 MG PO TABS
ORAL_TABLET | ORAL | 2 refills | Status: DC
  Filled 2021-11-23: qty 90, 90d supply, fill #0

## 2021-11-23 MED ORDER — METFORMIN HCL 1000 MG PO TABS
ORAL_TABLET | ORAL | 2 refills | Status: DC
  Filled 2021-11-23: qty 180, 90d supply, fill #0
  Filled 2022-02-01: qty 180, 90d supply, fill #1
  Filled 2022-03-31 – 2022-04-15 (×3): qty 180, 90d supply, fill #2

## 2021-11-23 MED ORDER — PANTOPRAZOLE SODIUM 40 MG PO TBEC
DELAYED_RELEASE_TABLET | ORAL | 3 refills | Status: DC
  Filled 2021-11-23: qty 90, 90d supply, fill #0

## 2021-11-23 MED ORDER — IRON 325 (65 FE) MG PO TABS
ORAL_TABLET | ORAL | 3 refills | Status: DC
  Filled 2021-11-23: qty 100, 100d supply, fill #0

## 2021-11-25 DIAGNOSIS — I10 Essential (primary) hypertension: Secondary | ICD-10-CM | POA: Diagnosis not present

## 2021-11-25 DIAGNOSIS — E1169 Type 2 diabetes mellitus with other specified complication: Secondary | ICD-10-CM | POA: Diagnosis not present

## 2021-11-25 DIAGNOSIS — I2510 Atherosclerotic heart disease of native coronary artery without angina pectoris: Secondary | ICD-10-CM | POA: Diagnosis not present

## 2021-11-25 DIAGNOSIS — E669 Obesity, unspecified: Secondary | ICD-10-CM | POA: Diagnosis not present

## 2021-12-01 ENCOUNTER — Other Ambulatory Visit (HOSPITAL_BASED_OUTPATIENT_CLINIC_OR_DEPARTMENT_OTHER): Payer: Self-pay

## 2021-12-02 ENCOUNTER — Other Ambulatory Visit (HOSPITAL_BASED_OUTPATIENT_CLINIC_OR_DEPARTMENT_OTHER): Payer: Self-pay

## 2021-12-02 MED ORDER — NOVOFINE PLUS PEN NEEDLE 32G X 4 MM MISC
3 refills | Status: AC
  Filled 2021-12-02: qty 100, 90d supply, fill #0

## 2021-12-13 ENCOUNTER — Ambulatory Visit: Payer: PPO | Admitting: Dermatology

## 2021-12-13 DIAGNOSIS — D1801 Hemangioma of skin and subcutaneous tissue: Secondary | ICD-10-CM

## 2021-12-13 DIAGNOSIS — L820 Inflamed seborrheic keratosis: Secondary | ICD-10-CM

## 2021-12-13 DIAGNOSIS — Z1283 Encounter for screening for malignant neoplasm of skin: Secondary | ICD-10-CM

## 2021-12-13 DIAGNOSIS — D485 Neoplasm of uncertain behavior of skin: Secondary | ICD-10-CM

## 2021-12-13 DIAGNOSIS — L821 Other seborrheic keratosis: Secondary | ICD-10-CM | POA: Diagnosis not present

## 2021-12-13 NOTE — Patient Instructions (Signed)

## 2021-12-20 ENCOUNTER — Other Ambulatory Visit (HOSPITAL_BASED_OUTPATIENT_CLINIC_OR_DEPARTMENT_OTHER): Payer: Self-pay

## 2021-12-20 MED ORDER — ALPRAZOLAM 0.25 MG PO TABS
ORAL_TABLET | ORAL | 0 refills | Status: DC
  Filled 2021-12-20: qty 270, 90d supply, fill #0

## 2021-12-29 ENCOUNTER — Other Ambulatory Visit (HOSPITAL_BASED_OUTPATIENT_CLINIC_OR_DEPARTMENT_OTHER): Payer: Self-pay

## 2021-12-29 ENCOUNTER — Ambulatory Visit: Payer: PPO | Admitting: Cardiovascular Disease

## 2021-12-29 ENCOUNTER — Encounter: Payer: Self-pay | Admitting: Cardiovascular Disease

## 2021-12-29 VITALS — BP 132/68 | HR 97 | Ht 66.0 in | Wt 221.0 lb

## 2021-12-29 DIAGNOSIS — Z9861 Coronary angioplasty status: Secondary | ICD-10-CM

## 2021-12-29 DIAGNOSIS — I10 Essential (primary) hypertension: Secondary | ICD-10-CM

## 2021-12-29 DIAGNOSIS — E785 Hyperlipidemia, unspecified: Secondary | ICD-10-CM | POA: Diagnosis not present

## 2021-12-29 DIAGNOSIS — I2510 Atherosclerotic heart disease of native coronary artery without angina pectoris: Secondary | ICD-10-CM | POA: Diagnosis not present

## 2021-12-29 MED ORDER — EZETIMIBE 10 MG PO TABS: 10.0000 mg | ORAL_TABLET | Freq: Every day | ORAL | 3 refills | Status: DC

## 2021-12-29 MED ORDER — EZETIMIBE 10 MG PO TABS
10.0000 mg | ORAL_TABLET | Freq: Every day | ORAL | 3 refills | Status: DC
  Filled 2021-12-29 – 2022-03-07 (×4): qty 90, 90d supply, fill #0
  Filled 2022-03-31 – 2022-05-31 (×3): qty 90, 90d supply, fill #1
  Filled 2022-08-30: qty 90, 90d supply, fill #2
  Filled 2022-11-27: qty 90, 90d supply, fill #3

## 2021-12-29 NOTE — Assessment & Plan Note (Signed)
History of essential hypertension blood pressure measured today at 132/68.  He is on metoprolol and valsartan.

## 2021-12-29 NOTE — Patient Instructions (Addendum)
Medication Instructions:  START: ZETIA '10mg'$  ONCE DAILY  *If you need a refill on your cardiac medications before your next appointment, please call your pharmacy*  Lab Work:  Please return for FASTING Blood Work in [place]. No appointment needed, lab here at the office is open Monday-Friday from 8AM to 4PM and closed daily for lunch from 12:45-1:45.   If you have labs (blood work) drawn today and your tests are completely normal, you will receive your results only by: [place] (if you have MyChart) OR A paper copy in the mail If you have any lab test that is abnormal or we need to change your treatment, we will call you to review the results.  Testing/Procedures: None Ordered At This Time.   Follow-Up: At [facility], you and your health needs are our priority.  As part of our continuing mission to provide you with exceptional heart care, we have created designated Provider Care Teams.  These Care Teams include your primary Cardiologist (physician) and Advanced Practice Providers (APPs -  Physician Assistants and Nurse Practitioners) who all work together to provide you with the care you need, when you need it.  Your next appointment:   1 year(s)  The format for your next appointment:   In Person  Provider:   [place] Burow, MD

## 2021-12-29 NOTE — Progress Notes (Signed)
12/29/2021 Nain Rudd   [birth year]  [id number]  Primary Physician Sueanne Margarita, DO Primary Cardiologist: Lorretta Harp MD [name], [place]  HPI:  Connor Wright is a 71 y.o.     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  He originally was from [place].  He was referred by Dr. Nyoka Cowden for cardiovascular evaluation because of new onset dyspnea and chest pressure.  I last saw him in the office 07/20/2021. Risk factors include 40 pack years of tobacco abuse having quit 12 years ago, treated diabetes and hyperlipidemia as well as family history of heart disease with 2 brothers who had stents and bypass surgery.  He is never had a heart attack or stroke.  He is noticed increasing dyspnea on exertion and chest pressure with walking up a hill in the last several weeks which sounds fairly typical for angina. Because of his fairly typical symptoms I elected to proceed directly to outpatient radial diagnostic coronary angiography on 11/09/2017 which revealed a high-grade mid AV groove circumflex stenosis which I stented with a 3.5 x 16 mm long Synergy drug-eluting stent postdilated to 3.9 mm.  His symptoms resolved.     Since I saw him in the office 6 months ago he is remained stable.  His hemoglobin has remained stable as well.  He denies chest pain or shortness of breath.   Current Meds  Medication Sig   ALPRAZolam (XANAX) 0.25 MG tablet Take 1 tablet by mouth three times a day as needed for anxiety. Caution with sedation. Try to wean down. 90 days   Artificial Tear Ointment (DRY EYES OP) Place 1 drop into both eyes daily as needed (for dry eyes).   atorvastatin (LIPITOR) 80 MG tablet Take 80 mg by mouth daily.   clopidogrel (PLAVIX) 75 MG tablet Take 1 tablet by mouth daily   FEROSUL 325 (65 Fe) MG tablet Take 325 mg by mouth daily.   HYDROcodone-acetaminophen (NORCO/VICODIN) 5-325 MG tablet Take 1 tablet by mouth every 4  (four) hours as needed.   Insulin Pen Needle (NOVOFINE PLUS PEN NEEDLE) 32G X 4 MM MISC Use as directed for 90 days   metFORMIN (GLUCOPHAGE) 1000 MG tablet Take 1,000 mg by mouth 2 (two) times daily.   metFORMIN (GLUCOPHAGE) 1000 MG tablet Take 1 tablet by mouth twice a day with a meal   metoprolol succinate (TOPROL-XL) 50 MG 24 hr tablet Take 50 mg by mouth daily.   nitroGLYCERIN (NITROSTAT) 0.4 MG SL tablet Place 0.4 mg under the tongue every 5 (five) minutes as needed for chest pain.    pantoprazole (PROTONIX) 40 MG tablet Take 1 tablet (40 mg total) by mouth daily.   penicillin v potassium (VEETID) 500 MG tablet Take 500 mg by mouth 4 (four) times daily.   Semaglutide, 1 MG/DOSE, (OZEMPIC, 1 MG/DOSE,) 4 MG/3ML SOPN inject '1mg'$  under the skin weekly.   tadalafil (CIALIS) 5 MG tablet    tamsulosin (FLOMAX) 0.4 MG CAPS capsule Take 0.4 mg by mouth daily.   valsartan (DIOVAN) 40 MG tablet Take 1 tablet by mouth once a day   [DISCONTINUED] ALPRAZolam (XANAX) 0.25 MG tablet Take 0.25 mg by mouth 3 (three) times daily.   [DISCONTINUED] atorvastatin (LIPITOR) 80 MG tablet Take 1 tablet by mouth once a day   [DISCONTINUED] clopidogrel (PLAVIX) 75 MG tablet Take 1 tablet (75 mg total) by mouth daily with breakfast.   [DISCONTINUED] Ferrous Sulfate (IRON) 325 (65 Fe)  MG TABS Take one tablet by mouth daily   [DISCONTINUED] LUTEIN PO Take 1 capsule by mouth daily.   [DISCONTINUED] metoprolol succinate (TOPROL-XL) 50 MG 24 hr tablet Take 1 tablet by mouth once a day   [DISCONTINUED] pantoprazole (PROTONIX) 40 MG tablet Take 1 tablet by mouth daily   [DISCONTINUED] Semaglutide, 1 MG/DOSE, (OZEMPIC, 1 MG/DOSE,) 4 MG/3ML SOPN inject '1mg'$  under the skin weekly   [DISCONTINUED] tadalafil (CIALIS) 5 MG tablet Take 1 tablet by mouth once a day   [DISCONTINUED] tamsulosin (FLOMAX) 0.4 MG CAPS capsule Take 1 capsule by mouth daily     No Known Allergies  Social History   Socioeconomic History   Marital  status: Married    Spouse name: Not on file   Number of children: Not on file   Years of education: Not on file   Highest education level: Not on file  Occupational History   Not on file  Tobacco Use   Smoking status: Former    Packs/day: 1.00    Years: 35.00    Total pack years: 35.00    Types: Cigarettes    Quit date: 2007    Years since quitting: 16.5   Smokeless tobacco: Never  Vaping Use   Vaping Use: Never used  Substance and Sexual Activity   Alcohol use: Not Currently    Comment: 11/09/2017 "nothing in the last 35 years"   Drug use: Never   Sexual activity: Yes  Other Topics Concern   Not on file  Social History Narrative   Not on file   Social Determinants of Health   Financial Resource Strain: Not on file  Food Insecurity: Not on file  Transportation Needs: Not on file  Physical Activity: Not on file  Stress: Not on file  Social Connections: Not on file  Intimate Partner Violence: Not on file     Review of Systems: General: negative for chills, fever, night sweats or weight changes.  Cardiovascular: negative for chest pain, dyspnea on exertion, edema, orthopnea, palpitations, paroxysmal nocturnal dyspnea or shortness of breath Dermatological: negative for rash Respiratory: negative for cough or wheezing Urologic: negative for hematuria Abdominal: negative for nausea, vomiting, diarrhea, bright red blood per rectum, melena, or hematemesis Neurologic: negative for visual changes, syncope, or dizziness All other systems reviewed and are otherwise negative except as noted above.    Blood pressure 132/68, pulse 97, height '5\' 6"'$  (1.676 m), weight 221 lb (100.2 kg).  General appearance: alert and no distress Neck: no adenopathy, no carotid bruit, no JVD, supple, symmetrical, trachea midline, and thyroid not enlarged, symmetric, no tenderness/mass/nodules Lungs: clear to auscultation bilaterally Heart: regular rate and rhythm, S1, S2 normal, no murmur, click,  rub or gallop Extremities: extremities normal, atraumatic, no cyanosis or edema Pulses: 2+ and symmetric Skin: Skin color, texture, turgor normal. No rashes or lesions Neurologic: Grossly normal  EKG sinus rhythm at 97 without ST or T wave changes.  Personally reviewed this EKG.  ASSESSMENT AND PLAN:   Essential hypertension History of essential hypertension blood pressure measured today at 132/68.  He is on metoprolol and valsartan.  Dyslipidemia History of dyslipidemia on high-dose statin therapy with lipid profile performed 06/10/2021 revealing total cholesterol and 72, LDL of 86 and HDL of 34.  I suspect this is partially related to dietary indiscretion.  He is not at goal for secondary prevention.  I am going to begin him on Zetia 10 mg a day and we will recheck a lipid liver profile in  3 months.  LDL goal less than 70  CAD S/P percutaneous coronary angioplasty CAD status post AV groove circumflex stenting by myself 11/09/2017 with a 3.5 mm x 16 mm long Synergy drug-eluting stent postdilated to 3.9 mm.  His symptoms resolved after that.  He denies chest pain or shortness of breath.     Lorretta Harp MD FACP,FACC,FAHA, [facility] 12/29/2021 11:36 AM

## 2021-12-29 NOTE — Addendum Note (Signed)
Addended by: Rexanne Mano B on: 12/29/2021 11:48 AM   Modules accepted: Orders

## 2021-12-29 NOTE — Assessment & Plan Note (Signed)
History of dyslipidemia on high-dose statin therapy with lipid profile performed 06/10/2021 revealing total cholesterol and 72, LDL of 86 and HDL of 34.  I suspect this is partially related to dietary indiscretion.  He is not at goal for secondary prevention.  I am going to begin him on Zetia 10 mg a day and we will recheck a lipid liver profile in 3 months.  LDL goal less than 70

## 2021-12-29 NOTE — Assessment & Plan Note (Signed)
CAD status post AV groove circumflex stenting by myself 11/09/2017 with a 3.5 mm x 16 mm long Synergy drug-eluting stent postdilated to 3.9 mm.  His symptoms resolved after that.  He denies chest pain or shortness of breath.

## 2022-01-03 ENCOUNTER — Encounter: Payer: Self-pay | Admitting: Dermatology

## 2022-01-03 NOTE — Progress Notes (Signed)
   Follow-Up Visit   Subjective  Connor Wright is a 71 y.o. male who presents for the following: Annual Exam (Here for annual skin exam. Concerns neck line has some lesions he wants looked at. ).  General skin examination, 2 spots have grown on chest Location:  Duration:  Quality:  Associated Signs/Symptoms: Modifying Factors:  Severity:  Timing: Context:   Objective  Well appearing patient in no apparent distress; mood and affect are within normal limits. General skin examination: No atypical pigmented lesions (all checked with dermoscopy), 2 possible nonmelanoma skin cancers upper chest will be biopsied.  Chest - Medial (Center) Pink-tan inflamed 7 mm crust       Neck - Anterior Pink pearly 7 mm papule         All skin waist up examined.   Assessment & Plan    Screening for malignant neoplasm of skin  Annual skin examination.  Neoplasm of uncertain behavior of skin (2) Chest - Medial (Center)  Skin / nail biopsy Type of biopsy: tangential   Informed consent: discussed and consent obtained   Timeout: patient name, date of birth, surgical site, and procedure verified   Anesthesia: the lesion was anesthetized in a standard fashion   Anesthetic:  1% lidocaine w/ epinephrine 1-100,000 local infiltration Instrument used: flexible razor blade   Hemostasis achieved with: ferric subsulfate   Outcome: patient tolerated procedure well   Post-procedure details: wound care instructions given    Specimen 1 - Surgical pathology Differential Diagnosis: SK  Check Margins: No  Neck - Anterior  Skin / nail biopsy Type of biopsy: tangential   Informed consent: discussed and consent obtained   Timeout: patient name, date of birth, surgical site, and procedure verified   Anesthesia: the lesion was anesthetized in a standard fashion   Anesthetic:  1% lidocaine w/ epinephrine 1-100,000 local infiltration Instrument used: flexible razor blade   Hemostasis  achieved with: ferric subsulfate   Outcome: patient tolerated procedure well   Post-procedure details: wound care instructions given    Specimen 2 - Surgical pathology Differential Diagnosis: SK  Check Margins: No  Seborrheic keratosis (2) Head - Anterior (Face); Mid Back  Hemangioma of skin Scalp      I, Lavonna Monarch, MD, have reviewed all documentation for this visit.  The documentation on 01/03/22 for the exam, diagnosis, procedures, and orders are all accurate and complete.

## 2022-01-17 ENCOUNTER — Other Ambulatory Visit (HOSPITAL_BASED_OUTPATIENT_CLINIC_OR_DEPARTMENT_OTHER): Payer: Self-pay

## 2022-01-18 DIAGNOSIS — D509 Iron deficiency anemia, unspecified: Secondary | ICD-10-CM | POA: Diagnosis not present

## 2022-01-18 DIAGNOSIS — N401 Enlarged prostate with lower urinary tract symptoms: Secondary | ICD-10-CM | POA: Diagnosis not present

## 2022-01-18 DIAGNOSIS — F419 Anxiety disorder, unspecified: Secondary | ICD-10-CM | POA: Diagnosis not present

## 2022-01-18 DIAGNOSIS — R052 Subacute cough: Secondary | ICD-10-CM | POA: Diagnosis not present

## 2022-01-18 DIAGNOSIS — E1169 Type 2 diabetes mellitus with other specified complication: Secondary | ICD-10-CM | POA: Diagnosis not present

## 2022-01-18 DIAGNOSIS — Z8619 Personal history of other infectious and parasitic diseases: Secondary | ICD-10-CM | POA: Diagnosis not present

## 2022-01-18 DIAGNOSIS — E785 Hyperlipidemia, unspecified: Secondary | ICD-10-CM | POA: Diagnosis not present

## 2022-01-18 DIAGNOSIS — I10 Essential (primary) hypertension: Secondary | ICD-10-CM | POA: Diagnosis not present

## 2022-01-18 DIAGNOSIS — I2510 Atherosclerotic heart disease of native coronary artery without angina pectoris: Secondary | ICD-10-CM | POA: Diagnosis not present

## 2022-01-18 DIAGNOSIS — Z6834 Body mass index (BMI) 34.0-34.9, adult: Secondary | ICD-10-CM | POA: Diagnosis not present

## 2022-02-01 ENCOUNTER — Other Ambulatory Visit (HOSPITAL_BASED_OUTPATIENT_CLINIC_OR_DEPARTMENT_OTHER): Payer: Self-pay

## 2022-02-01 MED ORDER — TADALAFIL 5 MG PO TABS
5.0000 mg | ORAL_TABLET | Freq: Every day | ORAL | 3 refills | Status: DC
Start: 2022-02-01 — End: 2023-01-23
  Filled 2022-02-01: qty 90, 90d supply, fill #0
  Filled 2022-03-31 – 2022-04-15 (×3): qty 90, 90d supply, fill #1
  Filled 2022-07-11: qty 90, 90d supply, fill #2
  Filled 2022-10-25 (×2): qty 90, 90d supply, fill #3

## 2022-02-25 ENCOUNTER — Other Ambulatory Visit (HOSPITAL_BASED_OUTPATIENT_CLINIC_OR_DEPARTMENT_OTHER): Payer: Self-pay

## 2022-02-27 ENCOUNTER — Encounter (HOSPITAL_BASED_OUTPATIENT_CLINIC_OR_DEPARTMENT_OTHER): Payer: Self-pay | Admitting: Pharmacist

## 2022-02-27 ENCOUNTER — Other Ambulatory Visit (HOSPITAL_BASED_OUTPATIENT_CLINIC_OR_DEPARTMENT_OTHER): Payer: Self-pay

## 2022-03-02 DIAGNOSIS — H524 Presbyopia: Secondary | ICD-10-CM | POA: Diagnosis not present

## 2022-03-02 DIAGNOSIS — Z961 Presence of intraocular lens: Secondary | ICD-10-CM | POA: Diagnosis not present

## 2022-03-02 DIAGNOSIS — H40051 Ocular hypertension, right eye: Secondary | ICD-10-CM | POA: Diagnosis not present

## 2022-03-02 DIAGNOSIS — H35373 Puckering of macula, bilateral: Secondary | ICD-10-CM | POA: Diagnosis not present

## 2022-03-07 ENCOUNTER — Other Ambulatory Visit (HOSPITAL_BASED_OUTPATIENT_CLINIC_OR_DEPARTMENT_OTHER): Payer: Self-pay

## 2022-03-29 DIAGNOSIS — I10 Essential (primary) hypertension: Secondary | ICD-10-CM | POA: Diagnosis not present

## 2022-03-29 DIAGNOSIS — E669 Obesity, unspecified: Secondary | ICD-10-CM | POA: Diagnosis not present

## 2022-03-29 DIAGNOSIS — E1169 Type 2 diabetes mellitus with other specified complication: Secondary | ICD-10-CM | POA: Diagnosis not present

## 2022-03-29 DIAGNOSIS — Z9861 Coronary angioplasty status: Secondary | ICD-10-CM | POA: Diagnosis not present

## 2022-03-29 DIAGNOSIS — I2510 Atherosclerotic heart disease of native coronary artery without angina pectoris: Secondary | ICD-10-CM | POA: Diagnosis not present

## 2022-03-29 DIAGNOSIS — E785 Hyperlipidemia, unspecified: Secondary | ICD-10-CM | POA: Diagnosis not present

## 2022-03-29 LAB — HEPATIC FUNCTION PANEL
ALT: 31 IU/L (ref 0–44)
AST: 27 IU/L (ref 0–40)
Albumin: 4.9 g/dL — ABNORMAL HIGH (ref 3.8–4.8)
Alkaline Phosphatase: 63 IU/L (ref 44–121)
Bilirubin Total: 0.4 mg/dL (ref 0.0–1.2)
Bilirubin, Direct: 0.16 mg/dL (ref 0.00–0.40)
Total Protein: 7.5 g/dL (ref 6.0–8.5)

## 2022-03-29 LAB — LIPID PANEL
Chol/HDL Ratio: 3.2 ratio (ref 0.0–5.0)
Cholesterol, Total: 133 mg/dL (ref 100–199)
HDL: 41 mg/dL (ref >39)
LDL Chol Calc (NIH): 60 mg/dL (ref 0–99)
Triglycerides: 192 mg/dL — ABNORMAL HIGH (ref 0–149)
VLDL Cholesterol Cal: 32 mg/dL (ref 5–40)

## 2022-03-31 ENCOUNTER — Other Ambulatory Visit (HOSPITAL_BASED_OUTPATIENT_CLINIC_OR_DEPARTMENT_OTHER): Payer: Self-pay

## 2022-03-31 MED ORDER — METOPROLOL SUCCINATE ER 50 MG PO TB24
50.0000 mg | ORAL_TABLET | Freq: Every day | ORAL | 3 refills | Status: DC
  Filled 2022-03-31: qty 90, 90d supply, fill #0
  Filled 2022-06-25: qty 90, 90d supply, fill #1
  Filled 2022-09-23: qty 90, 90d supply, fill #2
  Filled 2022-12-24: qty 90, 90d supply, fill #3

## 2022-03-31 MED ORDER — ALPRAZOLAM 0.25 MG PO TABS
0.2500 mg | ORAL_TABLET | Freq: Three times a day (TID) | ORAL | 0 refills | Status: DC | PRN
  Filled 2022-03-31: qty 270, 90d supply, fill #0

## 2022-03-31 MED ORDER — IRON 325 (65 FE) MG PO TABS
1.0000 | ORAL_TABLET | Freq: Every day | ORAL | 3 refills | Status: DC
  Filled 2022-03-31: qty 100, 100d supply, fill #0
  Filled 2022-07-05: qty 100, 100d supply, fill #1
  Filled 2022-10-13: qty 100, 100d supply, fill #2
  Filled 2023-01-21 – 2023-01-23 (×2): qty 100, 100d supply, fill #3

## 2022-03-31 MED ORDER — TAMSULOSIN HCL 0.4 MG PO CAPS
0.4000 mg | ORAL_CAPSULE | Freq: Every day | ORAL | 3 refills | Status: DC
  Filled 2022-03-31: qty 90, 90d supply, fill #0
  Filled 2022-06-25: qty 90, 90d supply, fill #1
  Filled 2022-09-23: qty 90, 90d supply, fill #2
  Filled 2022-12-24: qty 90, 90d supply, fill #3

## 2022-03-31 MED ORDER — PANTOPRAZOLE SODIUM 40 MG PO TBEC
40.0000 mg | DELAYED_RELEASE_TABLET | Freq: Every day | ORAL | 3 refills | Status: DC
  Filled 2022-03-31: qty 90, 90d supply, fill #0
  Filled 2022-06-25: qty 90, 90d supply, fill #1
  Filled 2022-09-23: qty 90, 90d supply, fill #2
  Filled 2022-12-24: qty 90, 90d supply, fill #3

## 2022-04-01 ENCOUNTER — Other Ambulatory Visit (HOSPITAL_BASED_OUTPATIENT_CLINIC_OR_DEPARTMENT_OTHER): Payer: Self-pay

## 2022-04-15 ENCOUNTER — Other Ambulatory Visit (HOSPITAL_BASED_OUTPATIENT_CLINIC_OR_DEPARTMENT_OTHER): Payer: Self-pay

## 2022-05-09 ENCOUNTER — Other Ambulatory Visit (HOSPITAL_BASED_OUTPATIENT_CLINIC_OR_DEPARTMENT_OTHER): Payer: Self-pay

## 2022-05-09 ENCOUNTER — Other Ambulatory Visit: Payer: Self-pay

## 2022-05-10 ENCOUNTER — Other Ambulatory Visit (HOSPITAL_BASED_OUTPATIENT_CLINIC_OR_DEPARTMENT_OTHER): Payer: Self-pay

## 2022-05-31 ENCOUNTER — Other Ambulatory Visit (HOSPITAL_BASED_OUTPATIENT_CLINIC_OR_DEPARTMENT_OTHER): Payer: Self-pay

## 2022-06-09 ENCOUNTER — Ambulatory Visit
Admission: RE | Admit: 2022-06-09 | Discharge: 2022-06-09 | Disposition: A | Discharge status: Home / Self Care | Payer: PPO | Source: Ambulatory Visit | Attending: Urgent Care | Admitting: Urgent Care

## 2022-06-09 ENCOUNTER — Other Ambulatory Visit (HOSPITAL_BASED_OUTPATIENT_CLINIC_OR_DEPARTMENT_OTHER): Payer: Self-pay

## 2022-06-09 ENCOUNTER — Ambulatory Visit (HOSPITAL_BASED_OUTPATIENT_CLINIC_OR_DEPARTMENT_OTHER)
Admission: RE | Admit: 2022-06-09 | Discharge: 2022-06-09 | Disposition: A | Discharge status: Home / Self Care | Payer: PPO | Source: Ambulatory Visit | Attending: Urgent Care | Admitting: Urgent Care

## 2022-06-09 VITALS — BP 143/94 | HR 96 | Temp 98.2°F | Resp 18

## 2022-06-09 DIAGNOSIS — R053 Chronic cough: Secondary | ICD-10-CM | POA: Insufficient documentation

## 2022-06-09 DIAGNOSIS — B349 Viral infection, unspecified: Secondary | ICD-10-CM | POA: Diagnosis not present

## 2022-06-09 DIAGNOSIS — R059 Cough, unspecified: Secondary | ICD-10-CM | POA: Diagnosis not present

## 2022-06-09 DIAGNOSIS — R911 Solitary pulmonary nodule: Secondary | ICD-10-CM | POA: Diagnosis not present

## 2022-06-09 MED ORDER — PROMETHAZINE-DM 6.25-15 MG/5ML PO SYRP
2.5000 mL | ORAL_SOLUTION | Freq: Three times a day (TID) | ORAL | 0 refills | Status: DC | PRN
  Filled 2022-06-09: qty 100, 14d supply, fill #0

## 2022-06-09 MED ORDER — BENZONATATE 100 MG PO CAPS
100.0000 mg | ORAL_CAPSULE | Freq: Three times a day (TID) | ORAL | 0 refills | Status: DC | PRN
  Filled 2022-06-09 (×2): qty 30, 10d supply, fill #0

## 2022-06-09 NOTE — Discharge Instructions (Addendum)
I have placed orders to have an x-ray done at the med center in [facility]  Please had there now.  Do not go in through the emergency room. Go in through the main hospital. Let them know you are there for an x-ray and they will get the x-ray done. I will call you with your results and update our treatment plan if necessary after I get the report.  Please wait to go pick up your prescriptions for any medications I have prescribed for you until after we discussed your x-ray results.

## 2022-06-09 NOTE — ED Triage Notes (Signed)
Pt c/o slightly prod cough x 2-3 weeks-denies fever-NAD-steady gait

## 2022-06-09 NOTE — ED Provider Notes (Addendum)
[facility] - URGENT [facility]ARE [facility]ENTER  Note:  This document was prepared using Systems analyst and may include unintentional dictation errors.  MRN: [id number] DOB: [birth year]  Subjective:   Connor Wright is a 72 y.o. male presenting for 2-3 week history of acute onset persistent productive cough. Has had some wheezing, mild runny nose, slight post-nasal drainage. No history of asthma. No fever, chest pain, shob, sinus congestion. Has remote history of smoking, quit 20 years ago. Has DM II treated without insulin.   No current facility-administered medications for this encounter.  Current Outpatient Medications:    ALPRAZolam (XANAX) 0.25 MG tablet, Take 1 tablet by mouth three times a day as needed for anxiety. Caution with sedation. Try to wean down. 90 days, Disp: 270 tablet, Rfl: 0   ALPRAZolam (XANAX) 0.25 MG tablet, Take 1 tablet (0.25 mg total) by mouth 3 (three) times daily as needed for anxiety. Caution with sedation, try to wean down., Disp: 270 tablet, Rfl: 0   Artificial Tear Ointment (DRY EYES OP), Place 1 drop into both eyes daily as needed (for dry eyes)., Disp: , Rfl:    atorvastatin (LIPITOR) 80 MG tablet, Take 80 mg by mouth daily., Disp: , Rfl:    clopidogrel (PLAVIX) 75 MG tablet, Take 1 tablet by mouth daily, Disp: 90 tablet, Rfl: 3   ezetimibe (ZETIA) 10 MG tablet, Take 1 tablet (10 mg total) by mouth daily., Disp: 90 tablet, Rfl: 3   FEROSUL 325 (65 Fe) MG tablet, Take 325 mg by mouth daily., Disp: , Rfl:    Ferrous Sulfate (IRON) 325 (65 Fe) MG TABS, Take 1 tablet (325 mg total) by mouth daily., Disp: 100 tablet, Rfl: 3   HYDROcodone-acetaminophen (NORCO/VICODIN) 5-325 MG tablet, Take 1 tablet by mouth every 4 (four) hours as needed., Disp: , Rfl:    Insulin Pen Needle (NOVOFINE PLUS PEN NEEDLE) 32G X 4 MM MISC, Use as directed for 90 days, Disp: 100 each, Rfl: 3   metFORMIN (GLUCOPHAGE) 1000 MG tablet, Take 1,000 mg by mouth 2 (two) times  daily., Disp: , Rfl:    metFORMIN (GLUCOPHAGE) 1000 MG tablet, Take 1 tablet by mouth twice a day with a meal, Disp: 180 tablet, Rfl: 2   metoprolol succinate (TOPROL-XL) 50 MG 24 hr tablet, Take 50 mg by mouth daily., Disp: , Rfl: 5   metoprolol succinate (TOPROL-XL) 50 MG 24 hr tablet, Take 1 tablet (50 mg total) by mouth daily., Disp: 90 tablet, Rfl: 3   nitroGLYCERIN (NITROSTAT) 0.4 MG SL tablet, Place 0.4 mg under the tongue every 5 (five) minutes as needed for chest pain. , Disp: , Rfl: 5   pantoprazole (PROTONIX) 40 MG tablet, Take 1 tablet (40 mg total) by mouth daily., Disp: 90 tablet, Rfl: 3   pantoprazole (PROTONIX) 40 MG tablet, Take 1 tablet (40 mg total) by mouth daily., Disp: 90 tablet, Rfl: 3   penicillin v potassium (VEETID) 500 MG tablet, Take 500 mg by mouth 4 (four) times daily., Disp: , Rfl:    Semaglutide, 1 MG/DOSE, (OZEMPIC, 1 MG/DOSE,) 4 MG/3ML SOPN, inject '1mg'$  under the skin weekly., Disp: 3 mL, Rfl: 6   tadalafil (CIALIS) 5 MG tablet, , Disp: , Rfl:    tadalafil (CIALIS) 5 MG tablet, Take 1 tablet (5 mg total) by mouth daily., Disp: 90 tablet, Rfl: 3   tamsulosin (FLOMAX) 0.4 MG CAPS capsule, Take 0.4 mg by mouth daily., Disp: , Rfl:    tamsulosin (FLOMAX) 0.4 MG CAPS capsule,  Take 1 capsule (0.4 mg total) by mouth daily., Disp: 90 capsule, Rfl: 3   valsartan (DIOVAN) 40 MG tablet, Take 1 tablet by mouth once a day, Disp: 90 tablet, Rfl: 3   No Known Allergies  Past Medical History:  Diagnosis Date   Anxiety    Coronary artery disease    GERD (gastroesophageal reflux disease)    High cholesterol    Type II diabetes mellitus ([place])      Past Surgical History:  Procedure Laterality Date   CATARACT EXTRACTION W/ INTRAOCULAR LENS  IMPLANT, BILATERAL Bilateral    CORONARY ANGIOPLASTY WITH STENT PLACEMENT  11/09/2017   CORONARY STENT INTERVENTION N/A 11/09/2017   Procedure: CORONARY STENT INTERVENTION;  Surgeon: Lorretta Harp, MD;  Location: [facility];   Service: Cardiovascular;  Laterality: N/A;   EYE SURGERY     FRACTURE SURGERY     KNEE ARTHROSCOPY Left    LEFT HEART CATH AND CORONARY ANGIOGRAPHY N/A 11/09/2017   Procedure: LEFT HEART CATH AND CORONARY ANGIOGRAPHY;  Surgeon: Lorretta Harp, MD;  Location: [facility];  Service: Cardiovascular;  Laterality: N/A;   ORIF SHOULDER FRACTURE Left ~ [place] Bilateral     Family History  Problem Relation Age of Onset   Diabetes Sister     Social History   Tobacco Use   Smoking status: Former    Packs/day: 1.00    Years: 35.00    Total pack years: 35.00    Types: Cigarettes    Quit date: 2007    Years since quitting: 17.0   Smokeless tobacco: Never  Vaping Use   Vaping Use: Never used  Substance Use Topics   Alcohol use: Not Currently   Drug use: Never    ROS   Objective:   Vitals: BP (!) 143/94 (BP Location: Right Arm)   Pulse 96   Temp 98.2 F (36.8 C) (Oral)   Resp 18   SpO2 93%   Physical Exam Constitutional:      General: He is not in acute distress.    Appearance: Normal appearance. He is well-developed. He is not ill-appearing, toxic-appearing or diaphoretic.  HENT:     Head: Normocephalic and atraumatic.     Right Ear: External ear normal.     Left Ear: External ear normal.     Nose: Nose normal.     Mouth/Throat:     Mouth: Mucous membranes are moist.  Eyes:     General: No scleral icterus.       Right eye: No discharge.        Left eye: No discharge.     Extraocular Movements: Extraocular movements intact.  Cardiovascular:     Rate and Rhythm: Normal rate and regular rhythm.     Heart sounds: Normal heart sounds. No murmur heard.    No friction rub. No gallop.  Pulmonary:     Effort: Pulmonary effort is normal. No respiratory distress.     Breath sounds: Normal breath sounds. No stridor. No wheezing, rhonchi or rales.  Neurological:     Mental Status: He is alert and oriented to person,  place, and time.  Psychiatric:        Mood and Affect: Mood normal.        Behavior: Behavior normal.        Thought Content: Thought content normal.     Assessment and Plan :   PDMP not reviewed  this encounter.  1. Persistent cough     Will pursue outpatient imaging, x-ray order placed. X-ray machine is currently down, patient was displeased with this but was agreeable to present to the [facility] for imaging.  Will update treatment plan thereafter.   Jaynee Eagles, [place] 06/09/22 1215  DG Chest 2 View  Result Date: 06/09/2022 CLINICAL DATA:  Persistent cough. EXAM: CHEST - 2 VIEW COMPARISON:  Chest radiograph 10/11/2007 FINDINGS: The cardiomediastinal silhouette is normal. There is no focal consolidation or pulmonary edema. There is no pleural effusion or pneumothorax. There is nodular opacity measuring approximately 1.7 cm along the left heart border which was not present in 2009. There is no acute osseous abnormality. IMPRESSION: Nodular opacity along the left heart border measuring approximately 1.7 cm. Recommend chest CT for further evaluation. Electronically Signed   By: Valetta Mole M.D.   On: 06/09/2022 12:28     1. Acute viral syndrome   2. Persistent cough   3. Solitary pulmonary nodule     Recommended conservative management for viral respiratory illness.  Will avoid the use of prednisone given his diabetes.  Recommended cough suppression medications.  I reviewed in detail his x-ray findings and recommended he pursue a chest CT scan as an outpatient with his primary care provider.  I do believe it is appropriate to pursue this on a nonemergent basis.  Patient verbalizes understanding, is in agreement with the treatment plan. Counseled patient on potential for adverse effects with medications prescribed/recommended today, ER and return-to-clinic precautions discussed, patient verbalized understanding.    Jaynee Eagles, PA-C 06/09/22 1249

## 2022-06-10 ENCOUNTER — Encounter: Payer: Self-pay | Admitting: Internal Medicine

## 2022-06-20 ENCOUNTER — Encounter: Payer: Self-pay | Admitting: Internal Medicine

## 2022-06-20 ENCOUNTER — Other Ambulatory Visit: Payer: Self-pay | Admitting: Internal Medicine

## 2022-06-20 DIAGNOSIS — R911 Solitary pulmonary nodule: Secondary | ICD-10-CM

## 2022-06-25 ENCOUNTER — Other Ambulatory Visit (HOSPITAL_BASED_OUTPATIENT_CLINIC_OR_DEPARTMENT_OTHER): Payer: Self-pay

## 2022-06-27 ENCOUNTER — Other Ambulatory Visit: Payer: Self-pay

## 2022-06-27 ENCOUNTER — Other Ambulatory Visit (HOSPITAL_BASED_OUTPATIENT_CLINIC_OR_DEPARTMENT_OTHER): Payer: Self-pay

## 2022-06-27 ENCOUNTER — Ambulatory Visit
Admission: RE | Admit: 2022-06-27 | Discharge: 2022-06-27 | Disposition: A | Discharge status: Home / Self Care | Payer: PPO | Source: Ambulatory Visit | Attending: Internal Medicine | Admitting: Internal Medicine

## 2022-06-27 DIAGNOSIS — I3139 Other pericardial effusion (noninflammatory): Secondary | ICD-10-CM | POA: Diagnosis not present

## 2022-06-27 DIAGNOSIS — I700 Atherosclerosis of aorta: Secondary | ICD-10-CM | POA: Diagnosis not present

## 2022-06-27 DIAGNOSIS — R911 Solitary pulmonary nodule: Secondary | ICD-10-CM | POA: Diagnosis not present

## 2022-06-27 DIAGNOSIS — I7121 Aneurysm of the ascending aorta, without rupture: Secondary | ICD-10-CM | POA: Diagnosis not present

## 2022-06-27 MED ORDER — METFORMIN HCL 1000 MG PO TABS
1000.0000 mg | ORAL_TABLET | Freq: Two times a day (BID) | ORAL | 0 refills | Status: DC
  Filled 2022-06-27: qty 180, 90d supply, fill #0

## 2022-07-01 DIAGNOSIS — D509 Iron deficiency anemia, unspecified: Secondary | ICD-10-CM | POA: Diagnosis not present

## 2022-07-01 DIAGNOSIS — E1169 Type 2 diabetes mellitus with other specified complication: Secondary | ICD-10-CM | POA: Diagnosis not present

## 2022-07-01 DIAGNOSIS — D649 Anemia, unspecified: Secondary | ICD-10-CM | POA: Diagnosis not present

## 2022-07-01 DIAGNOSIS — E785 Hyperlipidemia, unspecified: Secondary | ICD-10-CM | POA: Diagnosis not present

## 2022-07-01 DIAGNOSIS — I10 Essential (primary) hypertension: Secondary | ICD-10-CM | POA: Diagnosis not present

## 2022-07-01 DIAGNOSIS — R82998 Other abnormal findings in urine: Secondary | ICD-10-CM | POA: Diagnosis not present

## 2022-07-01 DIAGNOSIS — R7989 Other specified abnormal findings of blood chemistry: Secondary | ICD-10-CM | POA: Diagnosis not present

## 2022-07-01 DIAGNOSIS — K219 Gastro-esophageal reflux disease without esophagitis: Secondary | ICD-10-CM | POA: Diagnosis not present

## 2022-07-05 ENCOUNTER — Other Ambulatory Visit (HOSPITAL_BASED_OUTPATIENT_CLINIC_OR_DEPARTMENT_OTHER): Payer: Self-pay

## 2022-07-08 ENCOUNTER — Other Ambulatory Visit (HOSPITAL_BASED_OUTPATIENT_CLINIC_OR_DEPARTMENT_OTHER): Payer: Self-pay | Admitting: Internal Medicine

## 2022-07-08 DIAGNOSIS — D509 Iron deficiency anemia, unspecified: Secondary | ICD-10-CM | POA: Diagnosis not present

## 2022-07-08 DIAGNOSIS — I719 Aortic aneurysm of unspecified site, without rupture: Secondary | ICD-10-CM | POA: Diagnosis not present

## 2022-07-08 DIAGNOSIS — I272 Pulmonary hypertension, unspecified: Secondary | ICD-10-CM | POA: Diagnosis not present

## 2022-07-08 DIAGNOSIS — F419 Anxiety disorder, unspecified: Secondary | ICD-10-CM | POA: Diagnosis not present

## 2022-07-08 DIAGNOSIS — Z1331 Encounter for screening for depression: Secondary | ICD-10-CM | POA: Diagnosis not present

## 2022-07-08 DIAGNOSIS — E1169 Type 2 diabetes mellitus with other specified complication: Secondary | ICD-10-CM | POA: Diagnosis not present

## 2022-07-08 DIAGNOSIS — N401 Enlarged prostate with lower urinary tract symptoms: Secondary | ICD-10-CM | POA: Diagnosis not present

## 2022-07-08 DIAGNOSIS — I10 Essential (primary) hypertension: Secondary | ICD-10-CM | POA: Diagnosis not present

## 2022-07-08 DIAGNOSIS — Z0000 Encounter for general adult medical examination without abnormal findings: Secondary | ICD-10-CM | POA: Diagnosis not present

## 2022-07-08 DIAGNOSIS — I2510 Atherosclerotic heart disease of native coronary artery without angina pectoris: Secondary | ICD-10-CM | POA: Diagnosis not present

## 2022-07-08 DIAGNOSIS — I700 Atherosclerosis of aorta: Secondary | ICD-10-CM | POA: Diagnosis not present

## 2022-07-08 DIAGNOSIS — E785 Hyperlipidemia, unspecified: Secondary | ICD-10-CM | POA: Diagnosis not present

## 2022-07-08 DIAGNOSIS — Z1389 Encounter for screening for other disorder: Secondary | ICD-10-CM | POA: Diagnosis not present

## 2022-07-11 ENCOUNTER — Other Ambulatory Visit (HOSPITAL_BASED_OUTPATIENT_CLINIC_OR_DEPARTMENT_OTHER): Payer: Self-pay

## 2022-07-11 ENCOUNTER — Other Ambulatory Visit: Payer: Self-pay

## 2022-07-12 ENCOUNTER — Other Ambulatory Visit: Payer: Self-pay

## 2022-07-12 ENCOUNTER — Other Ambulatory Visit (HOSPITAL_BASED_OUTPATIENT_CLINIC_OR_DEPARTMENT_OTHER): Payer: Self-pay

## 2022-07-12 MED ORDER — ALPRAZOLAM 0.25 MG PO TABS
0.2500 mg | ORAL_TABLET | Freq: Three times a day (TID) | ORAL | 0 refills | Status: DC | PRN
  Filled 2022-07-12 – 2022-07-13 (×4): qty 270, 90d supply, fill #0

## 2022-07-13 ENCOUNTER — Other Ambulatory Visit (HOSPITAL_BASED_OUTPATIENT_CLINIC_OR_DEPARTMENT_OTHER): Payer: Self-pay

## 2022-07-13 ENCOUNTER — Other Ambulatory Visit: Payer: Self-pay

## 2022-07-15 ENCOUNTER — Other Ambulatory Visit (HOSPITAL_COMMUNITY): Payer: Self-pay | Admitting: *Deleted

## 2022-07-18 ENCOUNTER — Ambulatory Visit (HOSPITAL_COMMUNITY)
Admission: RE | Admit: 2022-07-18 | Discharge: 2022-07-18 | Disposition: A | Discharge status: Home / Self Care | Payer: PPO | Source: Ambulatory Visit | Attending: Internal Medicine | Admitting: Internal Medicine

## 2022-07-18 DIAGNOSIS — D649 Anemia, unspecified: Secondary | ICD-10-CM | POA: Insufficient documentation

## 2022-07-18 MED ORDER — SODIUM CHLORIDE 0.9 % IV SOLN
510.0000 mg | Freq: Once | INTRAVENOUS | Status: AC
  Administered 2022-07-18: 510 mg via INTRAVENOUS
  Filled 2022-07-18: qty 510

## 2022-07-29 DIAGNOSIS — B9681 Helicobacter pylori [H. pylori] as the cause of diseases classified elsewhere: Secondary | ICD-10-CM | POA: Diagnosis not present

## 2022-07-29 DIAGNOSIS — Z7901 Long term (current) use of anticoagulants: Secondary | ICD-10-CM | POA: Diagnosis not present

## 2022-07-29 DIAGNOSIS — D509 Iron deficiency anemia, unspecified: Secondary | ICD-10-CM | POA: Diagnosis not present

## 2022-07-29 DIAGNOSIS — Z86010 Personal history of colonic polyps: Secondary | ICD-10-CM | POA: Diagnosis not present

## 2022-08-01 ENCOUNTER — Other Ambulatory Visit (HOSPITAL_BASED_OUTPATIENT_CLINIC_OR_DEPARTMENT_OTHER): Payer: Self-pay

## 2022-08-01 MED ORDER — NA SULFATE-K SULFATE-MG SULF 17.5-3.13-1.6 GM/177ML PO SOLN
ORAL | 0 refills | Status: DC
  Filled 2022-08-01: qty 354, 1d supply, fill #0

## 2022-08-02 DIAGNOSIS — B9681 Helicobacter pylori [H. pylori] as the cause of diseases classified elsewhere: Secondary | ICD-10-CM | POA: Diagnosis not present

## 2022-08-08 ENCOUNTER — Telehealth: Payer: Self-pay

## 2022-08-08 ENCOUNTER — Telehealth: Payer: Self-pay | Admitting: Cardiovascular Disease

## 2022-08-08 NOTE — Telephone Encounter (Signed)
Patient agreeable with telehealth appointment.   Consent given and med list reviewed.

## 2022-08-08 NOTE — Telephone Encounter (Signed)
   Name: Connor Wright  DOB: [birth year]  MRN: [id number]  Primary Cardiologist: Quay Burow, MD   Preoperative team, please contact this patient and set up a phone call appointment for further preoperative risk assessment. Please obtain consent and complete medication review. Thank you for your help.  I confirm that guidance regarding antiplatelet and oral anticoagulation therapy has been completed and, if necessary, noted below.  He may hold his Plavix for 5 days prior to the procedure. Please resume as soon as hemostasis is achieved.     Mable Fill, Marissa Nestle, NP 08/08/2022, 1:41 PM [place]

## 2022-08-08 NOTE — Telephone Encounter (Signed)
  Patient Consent for Virtual Visit         Connor Wright has provided verbal consent on 08/08/2022 for a virtual visit (video or telephone).   CONSENT FOR VIRTUAL VISIT FOR:  Connor Wright  By participating in this virtual visit I agree to the following:  I hereby voluntarily request, consent and authorize [facility] and its employed or contracted physicians, physician assistants, nurse practitioners or other licensed health care professionals (the Practitioner), to provide me with telemedicine health care services (the "Services") as deemed necessary by the treating Practitioner. I acknowledge and consent to receive the Services by the Practitioner via telemedicine. I understand that the telemedicine visit will involve communicating with the Practitioner through live audiovisual communication technology and the disclosure of certain medical information by electronic transmission. I acknowledge that I have been given the opportunity to request an in-person assessment or other available alternative prior to the telemedicine visit and am voluntarily participating in the telemedicine visit.  I understand that I have the right to withhold or withdraw my consent to the use of telemedicine in the course of my care at any time, without affecting my right to future care or treatment, and that the Practitioner or I may terminate the telemedicine visit at any time. I understand that I have the right to inspect all information obtained and/or recorded in the course of the telemedicine visit and may receive copies of available information for a reasonable fee.  I understand that some of the potential risks of receiving the Services via telemedicine include:  Delay or interruption in medical evaluation due to technological equipment failure or disruption; Information transmitted may not be sufficient (e.g. poor resolution of images) to allow for appropriate medical decision making by  the Practitioner; and/or  In rare instances, security protocols could fail, causing a breach of personal health information.  Furthermore, I acknowledge that it is my responsibility to provide information about my medical history, conditions and care that is complete and accurate to the best of my ability. I acknowledge that Practitioner's advice, recommendations, and/or decision may be based on factors not within their control, such as incomplete or inaccurate data provided by me or distortions of diagnostic images or specimens that may result from electronic transmissions. I understand that the practice of medicine is not an exact science and that Practitioner makes no warranties or guarantees regarding treatment outcomes. I acknowledge that a copy of this consent can be made available to me via my patient portal ([facility]), or I can request a printed copy by calling the office of [facility].    I understand that my insurance will be billed for this visit.   I have read or had this consent read to me. I understand the contents of this consent, which adequately explains the benefits and risks of the Services being provided via telemedicine.  I have been provided ample opportunity to ask questions regarding this consent and the Services and have had my questions answered to my satisfaction. I give my informed consent for the services to be provided through the use of telemedicine in my medical care

## 2022-08-08 NOTE — Telephone Encounter (Signed)
   Pre-operative Risk Assessment    Patient Name: Connor Wright  DOB: 1950/10/19 MRN: [id number]      Request for Surgical Clearance    Procedure:   Colonoscopy   Date of Surgery:  Clearance 08/16/22                                 Surgeon:  Dr. Arta Silence  Surgeon's Group or Practice Name: [facility]  Phone number:  [phone] Fax number:  [phone]   Type of Clearance Requested:   - Medical  - Pharmacy:  Hold Clopidogrel (Plavix) 5 day hold    Type of Anesthesia:  MAC   Additional requests/questions:    Signed, April Henson   08/08/2022, 1:25 PM

## 2022-08-11 ENCOUNTER — Ambulatory Visit: Payer: PPO | Attending: Nurse Practitioner | Admitting: Nurse Practitioner

## 2022-08-11 DIAGNOSIS — Z01810 Encounter for preprocedural cardiovascular examination: Secondary | ICD-10-CM

## 2022-08-11 NOTE — Progress Notes (Signed)
Virtual Visit via Telephone Note   Because of Connor Wright's co-morbid illnesses, he is at least at moderate risk for complications without adequate follow up.  This format is felt to be most appropriate for this patient at this time.  The patient did not have access to video technology/had technical difficulties with video requiring transitioning to audio format only (telephone).  All issues noted in this document were discussed and addressed.  No physical exam could be performed with this format.  Please refer to the patient's chart for his consent to telehealth for [facility].  Evaluation Performed:  Preoperative cardiovascular risk assessment _____________   Date:  08/11/2022   Patient ID:  Connor Wright, DOB 14-Feb-1951, MRN [id number] Patient Location:  Home Provider location:   Office  Primary Care Provider:  Sueanne Margarita, DO Primary Cardiologist:  Quay Burow, MD  Chief Complaint / Patient Profile   72 y.o. y/o male with a h/o CAD s/p DES-LCx in 2019, hypertension, and hyperlipidemia who is pending colonoscopy on 08/16/2022 with Dr. Arta Silence of [facility] and presents today for telephonic preoperative cardiovascular risk assessment.  History of Present Illness    Connor Wright is a 72 y.o. male who presents via audio/video conferencing for a telehealth visit today.  Pt was last seen in cardiology clinic on 12/29/2021 by Dr. Gwenlyn Found.  At that time Nasiah Capone was doing well.  The patient is now pending procedure as outlined above. Since his last visit, he well from a cardiac standpoint.  He notes occasional lightheadedness when he gets out of bed in the morning, otherwise, he reports feeling well.  He denies chest pain, palpitations, dyspnea, pnd, orthopnea, n, v, dizziness, syncope, edema, weight gain, or early satiety. All other systems reviewed and are otherwise negative except as noted above.   Past Medical History     Past Medical History:  Diagnosis Date   Anxiety    Coronary artery disease    GERD (gastroesophageal reflux disease)    High cholesterol    Type II diabetes mellitus ([place])    Past Surgical History:  Procedure Laterality Date   CATARACT EXTRACTION W/ INTRAOCULAR LENS  IMPLANT, BILATERAL Bilateral    CORONARY ANGIOPLASTY WITH STENT PLACEMENT  11/09/2017   CORONARY STENT INTERVENTION N/A 11/09/2017   Procedure: CORONARY STENT INTERVENTION;  Surgeon: Lorretta Harp, MD;  Location: [facility];  Service: Cardiovascular;  Laterality: N/A;   EYE SURGERY     FRACTURE SURGERY     KNEE ARTHROSCOPY Left    LEFT HEART CATH AND CORONARY ANGIOGRAPHY N/A 11/09/2017   Procedure: LEFT HEART CATH AND CORONARY ANGIOGRAPHY;  Surgeon: Lorretta Harp, MD;  Location: [facility];  Service: Cardiovascular;  Laterality: N/A;   ORIF SHOULDER FRACTURE Left ~ [place] OF MACULAR HOLE Bilateral     Allergies  No Known Allergies  Home Medications    Prior to Admission medications   Medication Sig Start Date End Date Taking? Authorizing Provider  ALPRAZolam Duanne Moron) 0.25 MG tablet Take 1 tablet by mouth three times a day as needed for anxiety. Caution with sedation. Try to wean down. 90 days 12/20/21     ALPRAZolam (XANAX) 0.25 MG tablet Take 1 tablet (0.25 mg total) by mouth 3 (three) times daily as needed for anxiety. Caution with sedation, try to wean down. 03/31/22     ALPRAZolam (XANAX) 0.25 MG tablet Take 1 tablet (0.25 mg total) by mouth 3 (  three) times daily as needed for anxiety. Caution with sedation, try to wean down 90 days 07/12/22     Artificial Tear Ointment (DRY EYES OP) Place 1 drop into both eyes daily as needed (for dry eyes).    [provider]  atorvastatin (LIPITOR) 80 MG tablet Take 80 mg by mouth daily.    [provider]  benzonatate (TESSALON) 100 MG capsule Take 1 capsule (100 mg total) by mouth 3 (three) times daily as  needed for cough. 06/09/22   Jaynee Eagles, PA-C  clopidogrel (PLAVIX) 75 MG tablet Take 1 tablet by mouth daily 11/23/21     ezetimibe (ZETIA) 10 MG tablet Take 1 tablet (10 mg total) by mouth daily. 12/29/21   Lorretta Harp, MD  Ferrous Sulfate (IRON) 325 (65 Fe) MG TABS Take 1 tablet (325 mg total) by mouth daily. 03/31/22     Insulin Pen Needle (NOVOFINE PLUS PEN NEEDLE) 32G X 4 MM MISC Use as directed for 90 days 12/02/21   Tivis Ringer, RPH-CPP  metFORMIN (GLUCOPHAGE) 1000 MG tablet Take 1,000 mg by mouth 2 (two) times daily. 12/03/20   [provider]  metFORMIN (GLUCOPHAGE) 1000 MG tablet Take 1 tablet (1,000 mg total) by mouth 2 (two) times daily with a meal. 06/27/22     metoprolol succinate (TOPROL-XL) 50 MG 24 hr tablet Take 50 mg by mouth daily. 10/31/17   [provider]  metoprolol succinate (TOPROL-XL) 50 MG 24 hr tablet Take 1 tablet (50 mg total) by mouth daily. 03/31/22     Na Sulfate-K Sulfate-Mg Sulf 17.5-3.13-1.6 GM/177ML SOLN Take as directed. 07/29/22     nitroGLYCERIN (NITROSTAT) 0.4 MG SL tablet Place 0.4 mg under the tongue every 5 (five) minutes as needed for chest pain.  10/31/17   [provider]  pantoprazole (PROTONIX) 40 MG tablet Take 1 tablet (40 mg total) by mouth daily. 11/11/17   [facility], Tami Lin, PA  pantoprazole (PROTONIX) 40 MG tablet Take 1 tablet (40 mg total) by mouth daily. 03/31/22     Semaglutide, 1 MG/DOSE, (OZEMPIC, 1 MG/DOSE,) 4 MG/3ML SOPN inject '1mg'$  under the skin weekly. Patient taking differently: Inject 0.5 mg into the skin once a week. 08/19/21     tadalafil (CIALIS) 5 MG tablet  10/22/19   [provider]  tadalafil (CIALIS) 5 MG tablet Take 1 tablet (5 mg total) by mouth daily. 02/01/22     tamsulosin (FLOMAX) 0.4 MG CAPS capsule Take 0.4 mg by mouth daily.    [provider]  tamsulosin (FLOMAX) 0.4 MG CAPS capsule Take 1 capsule (0.4 mg total) by mouth daily. 03/31/22     valsartan (DIOVAN) 40 MG tablet Take 1  tablet by mouth once a day 11/23/21       Physical Exam    Vital Signs:  Xabier Rosenman does not have vital signs available for review today.  Given telephonic nature of communication, physical exam is limited. AAOx3. NAD. Normal affect.  Speech and respirations are unlabored.  Accessory Clinical Findings    None  Assessment & Plan    1.  Preoperative Cardiovascular Risk Assessment:  According to the Revised Cardiac Risk Index (RCRI), his Perioperative Risk of Major Cardiac Event is (%): 0.9. His Functional Capacity in METs is: 6.45 according to the [facility] Activity Status Index (DASI).Therefore, based on ACC/AHA guidelines, patient would be at acceptable risk for the planned procedure without further cardiovascular testing.  The patient was advised that if he develops new symptoms prior  to surgery to contact our office to arrange for a follow-up visit, and he verbalized understanding.   Per office protocol, he may hold Plavix for 5 days prior to procedure. Please resume Plavix as soon as possible postprocedure, at the discretion of the surgeon.    A copy of this note will be routed to requesting surgeon.  Time:   Today, I have spent 6 minutes with the patient with telehealth technology discussing medical history, symptoms, and management plan.     Lenna Sciara, NP  08/11/2022, 2:31 PM

## 2022-08-16 DIAGNOSIS — Z09 Encounter for follow-up examination after completed treatment for conditions other than malignant neoplasm: Secondary | ICD-10-CM | POA: Diagnosis not present

## 2022-08-16 DIAGNOSIS — D120 Benign neoplasm of cecum: Secondary | ICD-10-CM | POA: Diagnosis not present

## 2022-08-16 DIAGNOSIS — K648 Other hemorrhoids: Secondary | ICD-10-CM | POA: Diagnosis not present

## 2022-08-16 DIAGNOSIS — K635 Polyp of colon: Secondary | ICD-10-CM | POA: Diagnosis not present

## 2022-08-16 DIAGNOSIS — Z86010 Personal history of colonic polyps: Secondary | ICD-10-CM | POA: Diagnosis not present

## 2022-08-16 DIAGNOSIS — D122 Benign neoplasm of ascending colon: Secondary | ICD-10-CM | POA: Diagnosis not present

## 2022-08-16 DIAGNOSIS — K5730 Diverticulosis of large intestine without perforation or abscess without bleeding: Secondary | ICD-10-CM | POA: Diagnosis not present

## 2022-09-13 DIAGNOSIS — E1169 Type 2 diabetes mellitus with other specified complication: Secondary | ICD-10-CM | POA: Diagnosis not present

## 2022-09-13 DIAGNOSIS — E669 Obesity, unspecified: Secondary | ICD-10-CM | POA: Diagnosis not present

## 2022-09-13 DIAGNOSIS — I10 Essential (primary) hypertension: Secondary | ICD-10-CM | POA: Diagnosis not present

## 2022-09-13 DIAGNOSIS — I2510 Atherosclerotic heart disease of native coronary artery without angina pectoris: Secondary | ICD-10-CM | POA: Diagnosis not present

## 2022-09-18 ENCOUNTER — Other Ambulatory Visit (HOSPITAL_BASED_OUTPATIENT_CLINIC_OR_DEPARTMENT_OTHER): Payer: Self-pay

## 2022-09-19 ENCOUNTER — Other Ambulatory Visit (HOSPITAL_BASED_OUTPATIENT_CLINIC_OR_DEPARTMENT_OTHER): Payer: Self-pay

## 2022-09-19 MED ORDER — METFORMIN HCL 1000 MG PO TABS
1000.0000 mg | ORAL_TABLET | Freq: Two times a day (BID) | ORAL | 3 refills | Status: DC
  Filled 2022-09-19: qty 180, 90d supply, fill #0
  Filled 2022-12-19: qty 180, 90d supply, fill #1
  Filled 2023-03-19: qty 180, 90d supply, fill #2
  Filled 2023-06-13: qty 180, 90d supply, fill #3

## 2022-10-13 ENCOUNTER — Other Ambulatory Visit (HOSPITAL_BASED_OUTPATIENT_CLINIC_OR_DEPARTMENT_OTHER): Payer: Self-pay

## 2022-10-17 ENCOUNTER — Other Ambulatory Visit (HOSPITAL_BASED_OUTPATIENT_CLINIC_OR_DEPARTMENT_OTHER): Payer: Self-pay

## 2022-10-17 MED ORDER — ALPRAZOLAM 0.25 MG PO TABS
0.2500 mg | ORAL_TABLET | Freq: Three times a day (TID) | ORAL | 0 refills | Status: DC | PRN
  Filled 2022-10-17: qty 270, 90d supply, fill #0

## 2022-10-17 MED ORDER — OZEMPIC (1 MG/DOSE) 4 MG/3ML ~~LOC~~ SOPN
1.0000 mg | PEN_INJECTOR | SUBCUTANEOUS | 2 refills | Status: DC
  Filled 2022-10-17 – 2022-10-19 (×4): qty 3, 28d supply, fill #0
  Filled 2022-12-15: qty 3, 28d supply, fill #1
  Filled 2023-02-11: qty 3, 28d supply, fill #2

## 2022-10-18 ENCOUNTER — Other Ambulatory Visit (HOSPITAL_BASED_OUTPATIENT_CLINIC_OR_DEPARTMENT_OTHER): Payer: Self-pay

## 2022-10-18 ENCOUNTER — Other Ambulatory Visit: Payer: Self-pay

## 2022-10-19 ENCOUNTER — Other Ambulatory Visit: Payer: Self-pay

## 2022-10-20 ENCOUNTER — Other Ambulatory Visit (HOSPITAL_BASED_OUTPATIENT_CLINIC_OR_DEPARTMENT_OTHER): Payer: Self-pay

## 2022-10-21 ENCOUNTER — Other Ambulatory Visit (HOSPITAL_BASED_OUTPATIENT_CLINIC_OR_DEPARTMENT_OTHER): Payer: Self-pay

## 2022-10-25 ENCOUNTER — Other Ambulatory Visit (HOSPITAL_BASED_OUTPATIENT_CLINIC_OR_DEPARTMENT_OTHER): Payer: Self-pay

## 2022-10-26 ENCOUNTER — Other Ambulatory Visit (HOSPITAL_BASED_OUTPATIENT_CLINIC_OR_DEPARTMENT_OTHER): Payer: Self-pay

## 2022-10-28 DIAGNOSIS — M1711 Unilateral primary osteoarthritis, right knee: Secondary | ICD-10-CM | POA: Diagnosis not present

## 2022-10-31 ENCOUNTER — Other Ambulatory Visit (HOSPITAL_BASED_OUTPATIENT_CLINIC_OR_DEPARTMENT_OTHER): Payer: Self-pay

## 2022-11-14 DIAGNOSIS — D1801 Hemangioma of skin and subcutaneous tissue: Secondary | ICD-10-CM | POA: Diagnosis not present

## 2022-11-14 DIAGNOSIS — L570 Actinic keratosis: Secondary | ICD-10-CM | POA: Diagnosis not present

## 2022-11-14 DIAGNOSIS — D2272 Melanocytic nevi of left lower limb, including hip: Secondary | ICD-10-CM | POA: Diagnosis not present

## 2022-11-14 DIAGNOSIS — L821 Other seborrheic keratosis: Secondary | ICD-10-CM | POA: Diagnosis not present

## 2022-11-14 DIAGNOSIS — D2261 Melanocytic nevi of right upper limb, including shoulder: Secondary | ICD-10-CM | POA: Diagnosis not present

## 2022-11-23 ENCOUNTER — Other Ambulatory Visit (HOSPITAL_BASED_OUTPATIENT_CLINIC_OR_DEPARTMENT_OTHER): Payer: Self-pay

## 2022-11-23 MED ORDER — VALSARTAN 40 MG PO TABS
40.0000 mg | ORAL_TABLET | Freq: Every day | ORAL | 3 refills | Status: DC
  Filled 2022-11-24: qty 90, 90d supply, fill #0
  Filled 2023-02-17: qty 90, 90d supply, fill #1
  Filled 2023-05-18: qty 90, 90d supply, fill #2
  Filled 2023-08-16: qty 90, 90d supply, fill #3

## 2022-11-24 ENCOUNTER — Other Ambulatory Visit (HOSPITAL_BASED_OUTPATIENT_CLINIC_OR_DEPARTMENT_OTHER): Payer: Self-pay

## 2022-11-28 ENCOUNTER — Other Ambulatory Visit (HOSPITAL_BASED_OUTPATIENT_CLINIC_OR_DEPARTMENT_OTHER): Payer: Self-pay

## 2022-11-28 MED ORDER — ATORVASTATIN CALCIUM 80 MG PO TABS
80.0000 mg | ORAL_TABLET | Freq: Every day | ORAL | 0 refills | Status: DC
  Filled 2022-11-28: qty 90, 90d supply, fill #0

## 2023-01-05 ENCOUNTER — Encounter (HOSPITAL_BASED_OUTPATIENT_CLINIC_OR_DEPARTMENT_OTHER): Payer: Self-pay

## 2023-01-05 ENCOUNTER — Ambulatory Visit (HOSPITAL_BASED_OUTPATIENT_CLINIC_OR_DEPARTMENT_OTHER)
Admission: RE | Admit: 2023-01-05 | Discharge: 2023-01-05 | Disposition: A | Discharge status: Home / Self Care | Payer: PPO | Source: Ambulatory Visit | Attending: Internal Medicine | Admitting: Internal Medicine

## 2023-01-05 DIAGNOSIS — N281 Cyst of kidney, acquired: Secondary | ICD-10-CM | POA: Diagnosis not present

## 2023-01-05 DIAGNOSIS — I719 Aortic aneurysm of unspecified site, without rupture: Secondary | ICD-10-CM | POA: Insufficient documentation

## 2023-01-05 DIAGNOSIS — K7689 Other specified diseases of liver: Secondary | ICD-10-CM | POA: Diagnosis not present

## 2023-01-05 DIAGNOSIS — I3139 Other pericardial effusion (noninflammatory): Secondary | ICD-10-CM | POA: Diagnosis not present

## 2023-01-05 DIAGNOSIS — I7121 Aneurysm of the ascending aorta, without rupture: Secondary | ICD-10-CM | POA: Diagnosis not present

## 2023-01-05 LAB — POCT I-STAT CREATININE: Creatinine, Ser: 0.8 mg/dL (ref 0.61–1.24)

## 2023-01-05 MED ORDER — IOHEXOL 350 MG/ML SOLN
100.0000 mL | Freq: Once | INTRAVENOUS | Status: AC | PRN
  Administered 2023-01-05: 75 mL via INTRAVENOUS

## 2023-01-10 DIAGNOSIS — R911 Solitary pulmonary nodule: Secondary | ICD-10-CM | POA: Diagnosis not present

## 2023-01-10 DIAGNOSIS — E785 Hyperlipidemia, unspecified: Secondary | ICD-10-CM | POA: Diagnosis not present

## 2023-01-10 DIAGNOSIS — I10 Essential (primary) hypertension: Secondary | ICD-10-CM | POA: Diagnosis not present

## 2023-01-10 DIAGNOSIS — I2510 Atherosclerotic heart disease of native coronary artery without angina pectoris: Secondary | ICD-10-CM | POA: Diagnosis not present

## 2023-01-10 DIAGNOSIS — I272 Pulmonary hypertension, unspecified: Secondary | ICD-10-CM | POA: Diagnosis not present

## 2023-01-10 DIAGNOSIS — I700 Atherosclerosis of aorta: Secondary | ICD-10-CM | POA: Diagnosis not present

## 2023-01-10 DIAGNOSIS — E1169 Type 2 diabetes mellitus with other specified complication: Secondary | ICD-10-CM | POA: Diagnosis not present

## 2023-01-10 DIAGNOSIS — I719 Aortic aneurysm of unspecified site, without rupture: Secondary | ICD-10-CM | POA: Diagnosis not present

## 2023-01-10 DIAGNOSIS — F419 Anxiety disorder, unspecified: Secondary | ICD-10-CM | POA: Diagnosis not present

## 2023-01-10 DIAGNOSIS — K219 Gastro-esophageal reflux disease without esophagitis: Secondary | ICD-10-CM | POA: Diagnosis not present

## 2023-01-10 DIAGNOSIS — D509 Iron deficiency anemia, unspecified: Secondary | ICD-10-CM | POA: Diagnosis not present

## 2023-01-12 ENCOUNTER — Telehealth: Payer: Self-pay | Admitting: Cardiovascular Disease

## 2023-01-12 NOTE — Telephone Encounter (Signed)
Pt c/o Shortness Of Breath: STAT if SOB developed within the last 24 hours or pt is noticeably SOB on the phone  1. Are you currently SOB (can you hear that pt is SOB on the phone)? No  2. How long have you been experiencing SOB? 3 weeks  3. Are you SOB when sitting or when up moving around? Moving   4. Are you currently experiencing any other symptoms? No  Pt states that his breathing has been more heavy than normal.

## 2023-01-12 NOTE — Telephone Encounter (Signed)
Returned call to pt in regards to his SOB. Not currently experiencing any SOB. Episodes happen during exertion. Pt states it is more of  having a heavy breath with a little pressure in his chest during the episode. He states it happens when walking up a hill, exercising or climbing stairs. He would like to know what to do for it. I also advised him of maybe wanting to purchase a pulse oximeter.

## 2023-01-12 NOTE — Telephone Encounter (Signed)
Returned call to pt with Dr. Hazle Coca recommendation of making an appt with him or an app. Pt states he has an appt with Dr. Allyson Sabal on 02/03/23 at 11:15 am. I informed pt I would let Dr. Allyson Sabal know that as well.

## 2023-01-13 ENCOUNTER — Other Ambulatory Visit (HOSPITAL_BASED_OUTPATIENT_CLINIC_OR_DEPARTMENT_OTHER): Payer: Self-pay

## 2023-01-13 MED ORDER — CLOPIDOGREL BISULFATE 75 MG PO TABS
75.0000 mg | ORAL_TABLET | Freq: Every day | ORAL | 3 refills | Status: DC
  Filled 2023-01-13: qty 90, 90d supply, fill #0
  Filled 2023-04-08: qty 90, 90d supply, fill #1
  Filled 2023-05-18 – 2023-07-07 (×2): qty 90, 90d supply, fill #2
  Filled 2023-09-12 – 2023-09-13 (×2): qty 90, 90d supply, fill #3

## 2023-01-21 ENCOUNTER — Other Ambulatory Visit (HOSPITAL_BASED_OUTPATIENT_CLINIC_OR_DEPARTMENT_OTHER): Payer: Self-pay

## 2023-01-23 ENCOUNTER — Other Ambulatory Visit (HOSPITAL_BASED_OUTPATIENT_CLINIC_OR_DEPARTMENT_OTHER): Payer: Self-pay

## 2023-01-23 MED ORDER — TADALAFIL 5 MG PO TABS
5.0000 mg | ORAL_TABLET | Freq: Every day | ORAL | 3 refills | Status: DC
  Filled 2023-01-23: qty 90, 90d supply, fill #0
  Filled 2023-04-16: qty 90, 90d supply, fill #1
  Filled 2023-07-09 – 2023-07-10 (×2): qty 90, 90d supply, fill #2
  Filled 2023-09-12 – 2023-09-25 (×2): qty 90, 90d supply, fill #3

## 2023-01-25 ENCOUNTER — Other Ambulatory Visit (HOSPITAL_COMMUNITY): Payer: Self-pay | Admitting: *Deleted

## 2023-01-26 ENCOUNTER — Ambulatory Visit (HOSPITAL_COMMUNITY)
Admission: RE | Admit: 2023-01-26 | Discharge: 2023-01-26 | Disposition: A | Discharge status: Home / Self Care | Payer: PPO | Source: Ambulatory Visit | Attending: Internal Medicine | Admitting: Internal Medicine

## 2023-01-26 DIAGNOSIS — D649 Anemia, unspecified: Secondary | ICD-10-CM | POA: Diagnosis not present

## 2023-01-26 MED ORDER — SODIUM CHLORIDE 0.9 % IV SOLN
510.0000 mg | Freq: Once | INTRAVENOUS | Status: AC
  Administered 2023-01-26: 510 mg via INTRAVENOUS
  Filled 2023-01-26: qty 510

## 2023-02-03 ENCOUNTER — Ambulatory Visit: Payer: PPO | Attending: Cardiovascular Disease | Admitting: Cardiovascular Disease

## 2023-02-03 ENCOUNTER — Encounter: Payer: Self-pay | Admitting: Cardiovascular Disease

## 2023-02-03 ENCOUNTER — Other Ambulatory Visit (HOSPITAL_BASED_OUTPATIENT_CLINIC_OR_DEPARTMENT_OTHER): Payer: Self-pay

## 2023-02-03 VITALS — BP 136/88 | HR 105 | Ht 66.0 in | Wt 222.6 lb

## 2023-02-03 DIAGNOSIS — R000 Tachycardia, unspecified: Secondary | ICD-10-CM | POA: Diagnosis not present

## 2023-02-03 DIAGNOSIS — Z9861 Coronary angioplasty status: Secondary | ICD-10-CM | POA: Diagnosis not present

## 2023-02-03 DIAGNOSIS — I10 Essential (primary) hypertension: Secondary | ICD-10-CM

## 2023-02-03 DIAGNOSIS — E785 Hyperlipidemia, unspecified: Secondary | ICD-10-CM | POA: Diagnosis not present

## 2023-02-03 DIAGNOSIS — I712 Thoracic aortic aneurysm, without rupture, unspecified: Secondary | ICD-10-CM | POA: Diagnosis not present

## 2023-02-03 DIAGNOSIS — I2510 Atherosclerotic heart disease of native coronary artery without angina pectoris: Secondary | ICD-10-CM | POA: Diagnosis not present

## 2023-02-03 MED ORDER — METOPROLOL SUCCINATE ER 25 MG PO TB24
25.0000 mg | ORAL_TABLET | Freq: Every day | ORAL | 3 refills | Status: DC
Start: 2023-02-03 — End: 2023-02-10
  Filled 2023-02-03: qty 90, 90d supply, fill #0

## 2023-02-03 NOTE — Assessment & Plan Note (Signed)
History of dyslipidemia on high-dose atorvastatin and Zetia with lipid profile performed 03/29/2022 revealing total cholesterol 133, LDL 60 and HDL of 41.

## 2023-02-03 NOTE — Assessment & Plan Note (Signed)
History of essential hypertension blood pressure measured today of 136/88.  He is on Toprol-XL 50 mg a day.

## 2023-02-03 NOTE — Assessment & Plan Note (Signed)
Recent chest CTA performed by his PCP 01/05/2023 revealed a 4.7 cm ascending thoracic aortic aneurysm.  This will be repeated on an annual basis.

## 2023-02-03 NOTE — Progress Notes (Signed)
02/03/2023 Connor Wright   12-21-1950  [id number]  Primary Physician Charlane Ferretti, DO Primary Cardiologist: Runell Gess MD Nicholes Calamity, [place]  HPI:  Connor Wright is a 72 y.o.   moderately overweight married Caucasian male father of 2 children who works in Industrial/product designer.  He originally was from [place].  He was referred by Dr. Chilton Si for cardiovascular evaluation because of new onset dyspnea and chest pressure.  I last saw him in the office 12/29/2021.  His cardiovascular risk factors include 40 pack years of tobacco abuse having quit 12 years ago, treated diabetes and hyperlipidemia as well as family history of heart disease with 2 brothers who had stents and bypass surgery.  He is never had a heart attack or stroke.  He is noticed increasing dyspnea on exertion and chest pressure with walking up a hill in the last several weeks which sounds fairly typical for angina.  Because of his fairly typical symptoms I elected to proceed directly to outpatient radial diagnostic coronary angiography on 11/09/2017 which revealed a high-grade mid AV groove circumflex stenosis which I stented with a 3.5 x 16 mm long Synergy drug-eluting stent postdilated to 3.9 mm.  His symptoms resolved.     Since I saw him in the office 1 year ago he continues to do well.  He is mildly tachycardic rate of 105.  He denies chest pain or shortness of breath.  He is scheduled to go on a [place] cruise this coming October.   Current Meds  Medication Sig   ALPRAZolam (XANAX) 0.25 MG tablet Take 1 tablet (0.25 mg total) by mouth 3 (three) times daily as needed for anxiety. Caution: May cause drowsiness.   Artificial Tear Ointment (DRY EYES OP) Place 1 drop into both eyes daily as needed (for dry eyes).   atorvastatin (LIPITOR) 80 MG tablet Take 1 tablet (80 mg total) by mouth daily.   clopidogrel (PLAVIX) 75 MG tablet Take 1 tablet by mouth daily   ezetimibe (ZETIA) 10 MG tablet Take 1 tablet (10 mg  total) by mouth daily.   Ferrous Sulfate (IRON) 325 (65 Fe) MG TABS Take 1 tablet (325 mg total) by mouth daily.   Insulin Pen Needle (NOVOFINE PLUS PEN NEEDLE) 32G X 4 MM MISC Use as directed for 90 days   metFORMIN (GLUCOPHAGE) 1000 MG tablet Take 1 tablet (1,000 mg total) by mouth 2 (two) times daily with a meal.   metoprolol succinate (TOPROL XL) 25 MG 24 hr tablet Take 1 tablet (25 mg total) by mouth at bedtime.   nitroGLYCERIN (NITROSTAT) 0.4 MG SL tablet Place 0.4 mg under the tongue every 5 (five) minutes as needed for chest pain.    pantoprazole (PROTONIX) 40 MG tablet Take 1 tablet (40 mg total) by mouth daily.   Semaglutide, 1 MG/DOSE, (OZEMPIC, 1 MG/DOSE,) 4 MG/3ML SOPN Inject 1 mg into the skin once a week.   tadalafil (CIALIS) 5 MG tablet Take 1 tablet (5 mg total) by mouth daily.   tamsulosin (FLOMAX) 0.4 MG CAPS capsule Take 1 capsule (0.4 mg total) by mouth daily.   valsartan (DIOVAN) 40 MG tablet Take 1 tablet by mouth once a day   [DISCONTINUED] metoprolol succinate (TOPROL-XL) 50 MG 24 hr tablet Take 1 tablet (50 mg total) by mouth daily.     No Known Allergies  Social History   Socioeconomic History   Marital status: Married    Spouse name: Not on file   Number of  children: Not on file   Years of education: Not on file   Highest education level: Not on file  Occupational History   Not on file  Tobacco Use   Smoking status: Former    Current packs/day: 0.00    Average packs/day: 1 pack/day for 35.0 years (35.0 ttl pk-yrs)    Types: Cigarettes    Start date: 58    Quit date: 2007    Years since quitting: 17.6   Smokeless tobacco: Never  Vaping Use   Vaping status: Never Used  Substance and Sexual Activity   Alcohol use: Not Currently   Drug use: Never   Sexual activity: Yes  Other Topics Concern   Not on file  Social History Narrative   Not on file   Social Determinants of Health   Financial Resource Strain: Not on file  Food Insecurity: Not on  file  Transportation Needs: Not on file  Physical Activity: Not on file  Stress: Not on file  Social Connections: Not on file  Intimate Partner Violence: Not on file     Review of Systems: General: negative for chills, fever, night sweats or weight changes.  Cardiovascular: negative for chest pain, dyspnea on exertion, edema, orthopnea, palpitations, paroxysmal nocturnal dyspnea or shortness of breath Dermatological: negative for rash Respiratory: negative for cough or wheezing Urologic: negative for hematuria Abdominal: negative for nausea, vomiting, diarrhea, bright red blood per rectum, melena, or hematemesis Neurologic: negative for visual changes, syncope, or dizziness All other systems reviewed and are otherwise negative except as noted above.    Blood pressure 136/88, pulse (!) 105, height 5\' 6"  (1.676 m), weight 222 lb 9.6 oz (101 kg), SpO2 91%.  General appearance: alert and no distress Neck: no adenopathy, no carotid bruit, no JVD, supple, symmetrical, trachea midline, and thyroid not enlarged, symmetric, no tenderness/mass/nodules Lungs: clear to auscultation bilaterally Heart: Slightly tachycardic without murmurs gallops or rubs. Extremities: extremities normal, atraumatic, no cyanosis or edema Pulses: 2+ and symmetric Skin: Skin color, texture, turgor normal. No rashes or lesions Neurologic: Grossly normal thoracic aortic aneurysm  EKG EKG Interpretation Date/Time:  Friday February 03 2023 11:27:19 EDT Ventricular Rate:  105 PR Interval:  170 QRS Duration:  100 QT Interval:  360 QTC Calculation: 475 R Axis:   42  Text Interpretation: Sinus tachycardia When compared with ECG of 10-Nov-2017 03:37, No significant change was found Confirmed by Nanetta Batty [phone]) on 02/03/2023 11:52:13 AM    ASSESSMENT AND PLAN:   Essential hypertension History of essential hypertension blood pressure measured today of 136/88.  He is on Toprol-XL 50 mg a  day.  Dyslipidemia History of dyslipidemia on high-dose atorvastatin and Zetia with lipid profile performed 03/29/2022 revealing total cholesterol 133, LDL 60 and HDL of 41.  CAD S/P percutaneous coronary angioplasty History of CAD status post radial outpatient diagnostic cath by myself 11/09/2017 revealing a high-grade mid AV groove circumflex which I stented with a 3.5 mm x 16 mm long Synergy drug-eluting stent postdilated to 3.9 mm.  His symptoms resolved.  He remains on dual antiplatelet therapy.  Tachycardia Heart rate today is 105 and a year ago was 97.  He is on Toprol-XL 50 mg a day which I am going to increase to 75 mg a day.  Thoracic aortic aneurysm [facility]) Recent chest CTA performed by his PCP 01/05/2023 revealed a 4.7 cm ascending thoracic aortic aneurysm.  This will be repeated on an annual basis.     Runell Gess MD FACP,FACC,FAHA,  FSCAI 02/03/2023 12:11 PM

## 2023-02-03 NOTE — Patient Instructions (Signed)
Medication Instructions:  Your physician has recommended you make the following change in your medication:   Increase Toprol to 75 mg daily  *If you need a refill on your cardiac medications before your next appointment, please call your pharmacy*    Testing/Procedures: Non-Cardiac CT scanning, (CAT scanning), is a noninvasive, special x-ray that produces cross-sectional images of the body using x-rays and a computer. CT scans help physicians diagnose and treat medical conditions. For some CT exams, a contrast material is used to enhance visibility in the area of the body being studied. CT scans provide greater clarity and reveal more details than regular x-ray exams.    Follow-Up: At [facility], you and your health needs are our priority.  As part of our continuing mission to provide you with exceptional heart care, we have created designated Provider Care Teams.  These Care Teams include your primary Cardiologist (physician) and Advanced Practice Providers (APPs -  Physician Assistants and Nurse Practitioners) who all work together to provide you with the care you need, when you need it.  Your next appointment:   1 year(s)  Provider:   Nanetta Batty, MD

## 2023-02-03 NOTE — Assessment & Plan Note (Signed)
Heart rate today is 105 and a year ago was 97.  He is on Toprol-XL 50 mg a day which I am going to increase to 75 mg a day.

## 2023-02-03 NOTE — Assessment & Plan Note (Signed)
History of CAD status post radial outpatient diagnostic cath by myself 11/09/2017 revealing a high-grade mid AV groove circumflex which I stented with a 3.5 mm x 16 mm long Synergy drug-eluting stent postdilated to 3.9 mm.  His symptoms resolved.  He remains on dual antiplatelet therapy.

## 2023-02-07 ENCOUNTER — Ambulatory Visit (HOSPITAL_COMMUNITY): Payer: PPO

## 2023-02-10 ENCOUNTER — Other Ambulatory Visit: Payer: Self-pay

## 2023-02-10 ENCOUNTER — Other Ambulatory Visit (HOSPITAL_BASED_OUTPATIENT_CLINIC_OR_DEPARTMENT_OTHER): Payer: Self-pay

## 2023-02-10 DIAGNOSIS — I712 Thoracic aortic aneurysm, without rupture, unspecified: Secondary | ICD-10-CM

## 2023-02-10 DIAGNOSIS — I10 Essential (primary) hypertension: Secondary | ICD-10-CM

## 2023-02-10 DIAGNOSIS — E785 Hyperlipidemia, unspecified: Secondary | ICD-10-CM

## 2023-02-10 DIAGNOSIS — I2510 Atherosclerotic heart disease of native coronary artery without angina pectoris: Secondary | ICD-10-CM

## 2023-02-10 MED ORDER — METOPROLOL SUCCINATE ER 50 MG PO TB24
75.0000 mg | ORAL_TABLET | Freq: Every day | ORAL | 3 refills | Status: DC
Start: 2023-02-10 — End: 2024-03-05
  Filled 2023-02-10 – 2023-03-19 (×4): qty 135, 90d supply, fill #0
  Filled 2023-06-13: qty 135, 90d supply, fill #1
  Filled 2023-09-11: qty 135, 90d supply, fill #2
  Filled 2023-12-10: qty 135, 90d supply, fill #3

## 2023-02-10 NOTE — Addendum Note (Signed)
Addended by: Bernita Buffy on: 02/10/2023 01:57 PM   Modules accepted: Orders

## 2023-02-10 NOTE — Progress Notes (Signed)
Prescription for metoprolol succinate (toprol) 75mg  once daily sent in to [facility]. Pt was told at recent office visit on 02/03/23 to increase medication from 50mg  to 75mg . Pharmacy called and updated on new prescription.  Pt has been notified and he verbalizes understanding.

## 2023-02-12 ENCOUNTER — Other Ambulatory Visit (HOSPITAL_BASED_OUTPATIENT_CLINIC_OR_DEPARTMENT_OTHER): Payer: Self-pay

## 2023-02-13 ENCOUNTER — Other Ambulatory Visit: Payer: Self-pay

## 2023-02-13 ENCOUNTER — Other Ambulatory Visit (HOSPITAL_BASED_OUTPATIENT_CLINIC_OR_DEPARTMENT_OTHER): Payer: Self-pay

## 2023-02-20 ENCOUNTER — Other Ambulatory Visit (HOSPITAL_BASED_OUTPATIENT_CLINIC_OR_DEPARTMENT_OTHER): Payer: Self-pay

## 2023-02-20 MED ORDER — ATORVASTATIN CALCIUM 80 MG PO TABS
80.0000 mg | ORAL_TABLET | Freq: Every day | ORAL | 0 refills | Status: DC
  Filled 2023-02-20: qty 90, 90d supply, fill #0

## 2023-02-21 ENCOUNTER — Other Ambulatory Visit: Payer: Self-pay | Admitting: Cardiovascular Disease

## 2023-02-21 ENCOUNTER — Other Ambulatory Visit (HOSPITAL_BASED_OUTPATIENT_CLINIC_OR_DEPARTMENT_OTHER): Payer: Self-pay

## 2023-02-22 ENCOUNTER — Other Ambulatory Visit: Payer: Self-pay

## 2023-02-22 ENCOUNTER — Other Ambulatory Visit (HOSPITAL_BASED_OUTPATIENT_CLINIC_OR_DEPARTMENT_OTHER): Payer: Self-pay

## 2023-02-22 MED ORDER — EZETIMIBE 10 MG PO TABS
10.0000 mg | ORAL_TABLET | Freq: Every day | ORAL | 3 refills | Status: DC
  Filled 2023-02-22: qty 90, 90d supply, fill #0
  Filled 2023-04-16 – 2023-05-18 (×2): qty 90, 90d supply, fill #1
  Filled 2023-08-21: qty 90, 90d supply, fill #2
  Filled 2023-11-08: qty 90, 90d supply, fill #3

## 2023-03-07 ENCOUNTER — Other Ambulatory Visit: Payer: Self-pay

## 2023-03-07 ENCOUNTER — Other Ambulatory Visit (HOSPITAL_BASED_OUTPATIENT_CLINIC_OR_DEPARTMENT_OTHER): Payer: Self-pay

## 2023-03-07 MED ORDER — ALPRAZOLAM 0.25 MG PO TABS
0.2500 mg | ORAL_TABLET | Freq: Three times a day (TID) | ORAL | 0 refills | Status: DC | PRN
  Filled 2023-03-07: qty 230, 77d supply, fill #0

## 2023-03-08 ENCOUNTER — Other Ambulatory Visit (HOSPITAL_BASED_OUTPATIENT_CLINIC_OR_DEPARTMENT_OTHER): Payer: Self-pay

## 2023-03-08 DIAGNOSIS — H52201 Unspecified astigmatism, right eye: Secondary | ICD-10-CM | POA: Diagnosis not present

## 2023-03-08 DIAGNOSIS — E119 Type 2 diabetes mellitus without complications: Secondary | ICD-10-CM | POA: Diagnosis not present

## 2023-03-08 DIAGNOSIS — Z961 Presence of intraocular lens: Secondary | ICD-10-CM | POA: Diagnosis not present

## 2023-03-08 DIAGNOSIS — H40053 Ocular hypertension, bilateral: Secondary | ICD-10-CM | POA: Diagnosis not present

## 2023-03-19 ENCOUNTER — Other Ambulatory Visit (HOSPITAL_BASED_OUTPATIENT_CLINIC_OR_DEPARTMENT_OTHER): Payer: Self-pay

## 2023-03-20 ENCOUNTER — Other Ambulatory Visit: Payer: Self-pay

## 2023-03-20 ENCOUNTER — Other Ambulatory Visit (HOSPITAL_BASED_OUTPATIENT_CLINIC_OR_DEPARTMENT_OTHER): Payer: Self-pay

## 2023-03-20 MED ORDER — OZEMPIC (1 MG/DOSE) 4 MG/3ML ~~LOC~~ SOPN
1.0000 mg | PEN_INJECTOR | SUBCUTANEOUS | 2 refills | Status: DC
  Filled 2023-03-20: qty 3, 28d supply, fill #0
  Filled 2023-06-02: qty 3, 28d supply, fill #1
  Filled 2023-07-30: qty 3, 28d supply, fill #2

## 2023-03-22 ENCOUNTER — Other Ambulatory Visit (HOSPITAL_BASED_OUTPATIENT_CLINIC_OR_DEPARTMENT_OTHER): Payer: Self-pay

## 2023-03-22 MED ORDER — PANTOPRAZOLE SODIUM 40 MG PO TBEC
40.0000 mg | DELAYED_RELEASE_TABLET | Freq: Every day | ORAL | 3 refills | Status: AC
  Filled 2023-04-08: qty 90, 90d supply, fill #0
  Filled 2023-07-04: qty 90, 90d supply, fill #1
  Filled 2023-08-06 – 2023-08-16 (×4): qty 90, 90d supply, fill #2

## 2023-04-09 ENCOUNTER — Ambulatory Visit: Payer: PPO

## 2023-04-09 ENCOUNTER — Ambulatory Visit
Admission: EM | Admit: 2023-04-09 | Discharge: 2023-04-09 | Disposition: A | Discharge status: Home / Self Care | Payer: PPO | Attending: Internal Medicine | Admitting: Internal Medicine

## 2023-04-09 DIAGNOSIS — R058 Other specified cough: Secondary | ICD-10-CM | POA: Diagnosis not present

## 2023-04-09 DIAGNOSIS — R053 Chronic cough: Secondary | ICD-10-CM

## 2023-04-09 DIAGNOSIS — R059 Cough, unspecified: Secondary | ICD-10-CM | POA: Diagnosis not present

## 2023-04-09 MED ORDER — PROMETHAZINE-DM 6.25-15 MG/5ML PO SYRP: 5.0000 mL | ORAL_SOLUTION | Freq: Three times a day (TID) | ORAL | 0 refills | Status: DC | PRN

## 2023-04-09 NOTE — Discharge Instructions (Signed)
I will call you later today with your chest x-ray results and finalize the diagnoses and treatment plan.

## 2023-04-09 NOTE — ED Triage Notes (Signed)
Pt reports cough x 2 weeks. OTC cough meds gives no relief.

## 2023-04-09 NOTE — ED Provider Notes (Signed)
[facility]  Note:  This document was prepared using Conservation officer, historic buildings and may include unintentional dictation errors.  MRN: [id number] DOB: 1951/02/03  Subjective:   Connor Wright is a 72 y.o. male presenting for 2-week history of persistent hacking cough.  The cough does elicit some chest discomfort but does not feel that at rest.  Has felt general malaise and fatigue.  No runny or stuffy nose, sore throat, shortness of breath or wheezing.  He does have a history of a thoracic aortic aneurysm, pulmonary nodules.  Gets regular CT scans.  No smoking of any kind.  Patient quit many years ago.  No cardiac procedures in the past year.  No current facility-administered medications for this encounter.  Current Outpatient Medications:    ALPRAZolam (XANAX) 0.25 MG tablet, Take 1 tablet (0.25 mg total) by mouth 3 (three) times daily as needed for anxiety. Caution: May cause drowsiness., Disp: 270 tablet, Rfl: 0   ALPRAZolam (XANAX) 0.25 MG tablet, Take 1 tablet (0.25 mg total) by mouth 3 (three) times daily as needed for anxiety.  Caution with sedation. Try to wean down., Disp: 230 tablet, Rfl: 0   Artificial Tear Ointment (DRY EYES OP), Place 1 drop into both eyes daily as needed (for dry eyes)., Disp: , Rfl:    atorvastatin (LIPITOR) 80 MG tablet, Take 1 tablet (80 mg total) by mouth daily., Disp: 90 tablet, Rfl: 0   clopidogrel (PLAVIX) 75 MG tablet, Take 1 tablet by mouth daily, Disp: 90 tablet, Rfl: 3   ezetimibe (ZETIA) 10 MG tablet, Take 1 tablet (10 mg total) by mouth daily., Disp: 90 tablet, Rfl: 3   Ferrous Sulfate (IRON) 325 (65 Fe) MG TABS, Take 1 tablet (325 mg total) by mouth daily., Disp: 100 tablet, Rfl: 3   Insulin Pen Needle (NOVOFINE PLUS PEN NEEDLE) 32G X 4 MM MISC, Use as directed for 90 days, Disp: 100 each, Rfl: 3   metFORMIN (GLUCOPHAGE) 1000 MG tablet, Take 1 tablet (1,000 mg total) by mouth 2 (two) times daily with a meal.,  Disp: 180 tablet, Rfl: 3   metoprolol succinate (TOPROL XL) 50 MG 24 hr tablet, Take 1.5 tablets (75 mg total) by mouth at bedtime., Disp: 135 tablet, Rfl: 3   nitroGLYCERIN (NITROSTAT) 0.4 MG SL tablet, Place 0.4 mg under the tongue every 5 (five) minutes as needed for chest pain. , Disp: , Rfl: 5   pantoprazole (PROTONIX) 40 MG tablet, Take 1 tablet (40 mg total) by mouth daily., Disp: 90 tablet, Rfl: 3   Semaglutide, 1 MG/DOSE, (OZEMPIC, 1 MG/DOSE,) 4 MG/3ML SOPN, Inject 1 mg into the skin once a week., Disp: 3 mL, Rfl: 2   tadalafil (CIALIS) 5 MG tablet, Take 1 tablet (5 mg total) by mouth daily., Disp: 90 tablet, Rfl: 3   tamsulosin (FLOMAX) 0.4 MG CAPS capsule, Take 1 capsule (0.4 mg total) by mouth daily., Disp: 90 capsule, Rfl: 3   valsartan (DIOVAN) 40 MG tablet, Take 1 tablet by mouth once a day, Disp: 90 tablet, Rfl: 3   No Known Allergies  Past Medical History:  Diagnosis Date   Anxiety    Coronary artery disease    GERD (gastroesophageal reflux disease)    High cholesterol    Type II diabetes mellitus (HCC)      Past Surgical History:  Procedure Laterality Date   CATARACT EXTRACTION W/ INTRAOCULAR LENS  IMPLANT, BILATERAL Bilateral    CORONARY ANGIOPLASTY WITH STENT PLACEMENT  11/09/2017  CORONARY STENT INTERVENTION N/A 11/09/2017   Procedure: CORONARY STENT INTERVENTION;  Surgeon: Runell Gess, MD;  Location: [facility];  Service: Cardiovascular;  Laterality: N/A;   EYE SURGERY     FRACTURE SURGERY     KNEE ARTHROSCOPY Left    LEFT HEART CATH AND CORONARY ANGIOGRAPHY N/A 11/09/2017   Procedure: LEFT HEART CATH AND CORONARY ANGIOGRAPHY;  Surgeon: Runell Gess, MD;  Location: [facility];  Service: Cardiovascular;  Laterality: N/A;   ORIF SHOULDER FRACTURE Left ~ 1983   PARS PLANA VITRECTOMY W/ REPAIR OF MACULAR HOLE Bilateral     Family History  Problem Relation Age of Onset   Diabetes Sister     Social History   Tobacco Use   Smoking  status: Former    Current packs/day: 0.00    Average packs/day: 1 pack/day for 35.0 years (35.0 ttl pk-yrs)    Types: Cigarettes    Start date: 80    Quit date: 2007    Years since quitting: 17.8   Smokeless tobacco: Never  Vaping Use   Vaping status: Never Used  Substance Use Topics   Alcohol use: Not Currently   Drug use: Never    ROS   Objective:   Vitals: BP (!) 135/90 (BP Location: Right Arm)   Pulse (!) 101   Temp 98.6 F (37 C) (Oral)   Resp 20   SpO2 92%   Physical Exam Constitutional:      General: He is not in acute distress.    Appearance: Normal appearance. He is well-developed and normal weight. He is not ill-appearing, toxic-appearing or diaphoretic.  HENT:     Head: Normocephalic and atraumatic.     Right Ear: External ear normal.     Left Ear: External ear normal.     Nose: Nose normal.     Mouth/Throat:     Mouth: Mucous membranes are moist.     Pharynx: No pharyngeal swelling, oropharyngeal exudate, posterior oropharyngeal erythema or uvula swelling.     Tonsils: No tonsillar exudate or tonsillar abscesses. 0 on the right. 0 on the left.  Eyes:     General: No scleral icterus.       Right eye: No discharge.        Left eye: No discharge.     Extraocular Movements: Extraocular movements intact.  Cardiovascular:     Rate and Rhythm: Normal rate and regular rhythm.     Heart sounds: Normal heart sounds. No murmur heard.    No friction rub. No gallop.  Pulmonary:     Effort: Pulmonary effort is normal. No respiratory distress.     Breath sounds: Normal breath sounds. No stridor. No wheezing, rhonchi or rales.  Musculoskeletal:     Cervical back: Normal range of motion.  Neurological:     Mental Status: He is alert and oriented to person, place, and time.  Psychiatric:        Mood and Affect: Mood normal.        Behavior: Behavior normal.        Thought Content: Thought content normal.        Judgment: Judgment normal.     Assessment and  Plan :   PDMP not reviewed this encounter.  1. Persistent cough     X-ray over-read was pending at time of discharge, recommended follow up with only abnormal results. Otherwise will not call for negative over-read. Patient was in agreement.  Will finalize diagnosis and  treatment plan following the chest x-ray report.  For now, recommend supportive care.  Counseled patient on potential for adverse effects with medications prescribed/recommended today, ER and return-to-clinic precautions discussed, patient verbalized understanding.    Wallis Bamberg, PA-C 04/09/23 1155

## 2023-04-10 ENCOUNTER — Other Ambulatory Visit (HOSPITAL_BASED_OUTPATIENT_CLINIC_OR_DEPARTMENT_OTHER): Payer: Self-pay

## 2023-04-11 DIAGNOSIS — E1169 Type 2 diabetes mellitus with other specified complication: Secondary | ICD-10-CM | POA: Diagnosis not present

## 2023-04-11 DIAGNOSIS — I10 Essential (primary) hypertension: Secondary | ICD-10-CM | POA: Diagnosis not present

## 2023-04-11 DIAGNOSIS — I2510 Atherosclerotic heart disease of native coronary artery without angina pectoris: Secondary | ICD-10-CM | POA: Diagnosis not present

## 2023-04-13 ENCOUNTER — Ambulatory Visit
Admission: EM | Admit: 2023-04-13 | Discharge: 2023-04-13 | Disposition: A | Discharge status: Home / Self Care | Payer: PPO | Attending: Internal Medicine | Admitting: Internal Medicine

## 2023-04-13 ENCOUNTER — Other Ambulatory Visit (HOSPITAL_BASED_OUTPATIENT_CLINIC_OR_DEPARTMENT_OTHER): Payer: Self-pay

## 2023-04-13 DIAGNOSIS — J40 Bronchitis, not specified as acute or chronic: Secondary | ICD-10-CM | POA: Diagnosis not present

## 2023-04-13 DIAGNOSIS — E119 Type 2 diabetes mellitus without complications: Secondary | ICD-10-CM

## 2023-04-13 DIAGNOSIS — J329 Chronic sinusitis, unspecified: Secondary | ICD-10-CM

## 2023-04-13 MED ORDER — AMOXICILLIN-POT CLAVULANATE 875-125 MG PO TABS
1.0000 | ORAL_TABLET | Freq: Two times a day (BID) | ORAL | 0 refills | Status: DC
  Filled 2023-04-13: qty 20, 10d supply, fill #0

## 2023-04-13 MED ORDER — PREDNISONE 20 MG PO TABS
40.0000 mg | ORAL_TABLET | Freq: Every day | ORAL | 0 refills | Status: DC
  Filled 2023-04-13: qty 10, 5d supply, fill #0

## 2023-04-13 NOTE — ED Provider Notes (Signed)
[facility]  Note:  This document was prepared using Conservation officer, historic buildings and may include unintentional dictation errors.  MRN: [id number] DOB: 1950-10-14  Subjective:   Connor Wright is a 72 y.o. male presenting for recheck on persistent coughing.  Patient reports that he had no change in his symptoms following his last visit 4 days ago.  Chest x-ray was negative.  No chest pain, shortness of breath or wheezing.  Reports that his sinuses are much worse including congestion, sinus headaches, sinus drainage.  He is a type II diabetic, last A1c was 6.5%.  No history of kidney disease.  No history of respiratory disorders.  No smoking of any kind including cigarettes, cigars, vaping, marijuana use.    No current facility-administered medications for this encounter.  Current Outpatient Medications:    ALPRAZolam (XANAX) 0.25 MG tablet, Take 1 tablet (0.25 mg total) by mouth 3 (three) times daily as needed for anxiety. Caution: May cause drowsiness., Disp: 270 tablet, Rfl: 0   ALPRAZolam (XANAX) 0.25 MG tablet, Take 1 tablet (0.25 mg total) by mouth 3 (three) times daily as needed for anxiety.  Caution with sedation. Try to wean down., Disp: 230 tablet, Rfl: 0   Artificial Tear Ointment (DRY EYES OP), Place 1 drop into both eyes daily as needed (for dry eyes)., Disp: , Rfl:    atorvastatin (LIPITOR) 80 MG tablet, Take 1 tablet (80 mg total) by mouth daily., Disp: 90 tablet, Rfl: 0   clopidogrel (PLAVIX) 75 MG tablet, Take 1 tablet by mouth daily, Disp: 90 tablet, Rfl: 3   COMIRNATY syringe, , Disp: , Rfl:    ezetimibe (ZETIA) 10 MG tablet, Take 1 tablet (10 mg total) by mouth daily., Disp: 90 tablet, Rfl: 3   Ferrous Sulfate (IRON) 325 (65 Fe) MG TABS, Take 1 tablet (325 mg total) by mouth daily., Disp: 100 tablet, Rfl: 3   Insulin Pen Needle (NOVOFINE PLUS PEN NEEDLE) 32G X 4 MM MISC, Use as directed for 90 days, Disp: 100 each, Rfl: 3   metFORMIN  (GLUCOPHAGE) 1000 MG tablet, Take 1 tablet (1,000 mg total) by mouth 2 (two) times daily with a meal., Disp: 180 tablet, Rfl: 3   metoprolol succinate (TOPROL XL) 50 MG 24 hr tablet, Take 1.5 tablets (75 mg total) by mouth at bedtime., Disp: 135 tablet, Rfl: 3   nitroGLYCERIN (NITROSTAT) 0.4 MG SL tablet, Place 0.4 mg under the tongue every 5 (five) minutes as needed for chest pain. , Disp: , Rfl: 5   pantoprazole (PROTONIX) 40 MG tablet, Take 1 tablet (40 mg total) by mouth daily., Disp: 90 tablet, Rfl: 3   promethazine-dextromethorphan (PROMETHAZINE-DM) 6.25-15 MG/5ML syrup, Take 5 mLs by mouth 3 (three) times daily as needed for cough., Disp: 200 mL, Rfl: 0   Semaglutide, 1 MG/DOSE, (OZEMPIC, 1 MG/DOSE,) 4 MG/3ML SOPN, Inject 1 mg into the skin once a week., Disp: 3 mL, Rfl: 2   tadalafil (CIALIS) 5 MG tablet, Take 1 tablet (5 mg total) by mouth daily., Disp: 90 tablet, Rfl: 3   tamsulosin (FLOMAX) 0.4 MG CAPS capsule, Take 1 capsule (0.4 mg total) by mouth daily., Disp: 90 capsule, Rfl: 3   valsartan (DIOVAN) 40 MG tablet, Take 1 tablet by mouth once a day, Disp: 90 tablet, Rfl: 3   No Known Allergies  Past Medical History:  Diagnosis Date   Anxiety    Coronary artery disease    GERD (gastroesophageal reflux disease)    High  cholesterol    Type II diabetes mellitus (HCC)      Past Surgical History:  Procedure Laterality Date   CATARACT EXTRACTION W/ INTRAOCULAR LENS  IMPLANT, BILATERAL Bilateral    CORONARY ANGIOPLASTY WITH STENT PLACEMENT  11/09/2017   CORONARY STENT INTERVENTION N/A 11/09/2017   Procedure: CORONARY STENT INTERVENTION;  Surgeon: Runell Gess, MD;  Location: [facility];  Service: Cardiovascular;  Laterality: N/A;   EYE SURGERY     FRACTURE SURGERY     KNEE ARTHROSCOPY Left    LEFT HEART CATH AND CORONARY ANGIOGRAPHY N/A 11/09/2017   Procedure: LEFT HEART CATH AND CORONARY ANGIOGRAPHY;  Surgeon: Runell Gess, MD;  Location: [facility];   Service: Cardiovascular;  Laterality: N/A;   ORIF SHOULDER FRACTURE Left ~ 1983   PARS PLANA VITRECTOMY W/ REPAIR OF MACULAR HOLE Bilateral     Family History  Problem Relation Age of Onset   Diabetes Sister     Social History   Tobacco Use   Smoking status: Former    Current packs/day: 0.00    Average packs/day: 1 pack/day for 35.0 years (35.0 ttl pk-yrs)    Types: Cigarettes    Start date: 14    Quit date: 2007    Years since quitting: 17.8   Smokeless tobacco: Never  Vaping Use   Vaping status: Never Used  Substance Use Topics   Alcohol use: Not Currently   Drug use: Never    ROS   Objective:   Vitals: BP 136/79 (BP Location: Right Arm)   Pulse (!) 114   Temp 98 F (36.7 C) (Oral)   Resp (!) 22   SpO2 91%   Physical Exam Constitutional:      General: He is not in acute distress.    Appearance: Normal appearance. He is well-developed and normal weight. He is not ill-appearing, toxic-appearing or diaphoretic.  HENT:     Head: Normocephalic and atraumatic.     Right Ear: External ear normal.     Left Ear: External ear normal.     Nose: Congestion present. No rhinorrhea.     Mouth/Throat:     Mouth: Mucous membranes are moist.     Pharynx: Posterior oropharyngeal erythema (with associated postnasal drainage overlying pharynx) present. No oropharyngeal exudate.  Eyes:     General: No scleral icterus.       Right eye: No discharge.        Left eye: No discharge.     Extraocular Movements: Extraocular movements intact.  Cardiovascular:     Rate and Rhythm: Normal rate and regular rhythm.     Heart sounds: Normal heart sounds. No murmur heard.    No friction rub. No gallop.  Pulmonary:     Effort: Pulmonary effort is normal. No respiratory distress.     Breath sounds: No stridor. Examination of the right-middle field reveals rhonchi. Examination of the left-middle field reveals rhonchi. Examination of the left-lower field reveals rhonchi. Rhonchi present.  No wheezing or rales.  Musculoskeletal:     Cervical back: Normal range of motion.  Neurological:     Mental Status: He is alert and oriented to person, place, and time.  Psychiatric:        Mood and Affect: Mood normal.        Behavior: Behavior normal.        Thought Content: Thought content normal.        Judgment: Judgment normal.     DG Chest  2 View  Result Date: 04/09/2023 CLINICAL DATA:  Cough. EXAM: CHEST - 2 VIEW COMPARISON:  June 09, 2022. FINDINGS: The heart size and mediastinal contours are within normal limits. Both lungs are clear. The visualized skeletal structures are unremarkable. IMPRESSION: No active cardiopulmonary disease. Electronically Signed   By: Lupita Raider M.D.   On: 04/09/2023 12:17    Assessment and Plan :   PDMP not reviewed this encounter.  1. Sinobronchitis   2. Type 2 diabetes mellitus without complication, without long-term current use of insulin (HCC)    Recommend coverage of sinobronchitis with Augmentin, prednisone.  Continue with supportive care otherwise.  Deferred repeat imaging as he just had this 4 days ago. Counseled patient on potential for adverse effects with medications prescribed/recommended today, ER and return-to-clinic precautions discussed, patient verbalized understanding.    Wallis Bamberg, [place] 04/13/23 [phone]

## 2023-04-13 NOTE — ED Triage Notes (Signed)
Pt reports persistent cough over 2 weeks, nasal congestion worse. States prescribed  cough med gives no relief. Pt reporst eh was seeing at this UC 4 days ago.

## 2023-04-13 NOTE — Discharge Instructions (Addendum)
Start amoxicillin-clavulanate and prednisone to help with a bacterial infection called sinobronchitis. Continue to use the cough syrup. Use Tylenol at a dose of 500mg -650mg  once every 6 hours as needed for your aches, pains, fevers.

## 2023-04-14 ENCOUNTER — Other Ambulatory Visit (HOSPITAL_BASED_OUTPATIENT_CLINIC_OR_DEPARTMENT_OTHER): Payer: Self-pay

## 2023-04-16 ENCOUNTER — Other Ambulatory Visit (HOSPITAL_BASED_OUTPATIENT_CLINIC_OR_DEPARTMENT_OTHER): Payer: Self-pay

## 2023-04-17 ENCOUNTER — Other Ambulatory Visit (HOSPITAL_BASED_OUTPATIENT_CLINIC_OR_DEPARTMENT_OTHER): Payer: Self-pay

## 2023-04-17 ENCOUNTER — Other Ambulatory Visit: Payer: Self-pay

## 2023-04-17 MED ORDER — TAMSULOSIN HCL 0.4 MG PO CAPS
0.4000 mg | ORAL_CAPSULE | Freq: Every day | ORAL | 3 refills | Status: DC
  Filled 2023-04-17: qty 90, 90d supply, fill #0
  Filled 2023-07-09 – 2023-07-10 (×2): qty 90, 90d supply, fill #1
  Filled 2023-08-14 – 2023-09-25 (×3): qty 90, 90d supply, fill #2
  Filled 2024-01-08: qty 90, 90d supply, fill #3

## 2023-04-18 ENCOUNTER — Other Ambulatory Visit (HOSPITAL_BASED_OUTPATIENT_CLINIC_OR_DEPARTMENT_OTHER): Payer: Self-pay

## 2023-04-26 ENCOUNTER — Other Ambulatory Visit (HOSPITAL_BASED_OUTPATIENT_CLINIC_OR_DEPARTMENT_OTHER): Payer: Self-pay

## 2023-04-28 ENCOUNTER — Other Ambulatory Visit (HOSPITAL_BASED_OUTPATIENT_CLINIC_OR_DEPARTMENT_OTHER): Payer: Self-pay

## 2023-04-28 MED ORDER — FERROUS SULFATE 325 (65 FE) MG PO TABS
325.0000 mg | ORAL_TABLET | Freq: Every day | ORAL | 3 refills | Status: DC
  Filled 2023-04-28: qty 100, 100d supply, fill #0
  Filled 2023-07-09 – 2023-08-06 (×2): qty 100, 100d supply, fill #1
  Filled 2023-09-12 (×2): qty 100, 100d supply, fill #2
  Filled 2024-02-23 (×2): qty 100, 100d supply, fill #3

## 2023-05-18 ENCOUNTER — Other Ambulatory Visit (HOSPITAL_BASED_OUTPATIENT_CLINIC_OR_DEPARTMENT_OTHER): Payer: Self-pay

## 2023-05-18 ENCOUNTER — Other Ambulatory Visit: Payer: Self-pay

## 2023-05-19 ENCOUNTER — Other Ambulatory Visit (HOSPITAL_BASED_OUTPATIENT_CLINIC_OR_DEPARTMENT_OTHER): Payer: Self-pay

## 2023-05-19 MED ORDER — ALPRAZOLAM 0.25 MG PO TABS
0.2500 mg | ORAL_TABLET | Freq: Three times a day (TID) | ORAL | 0 refills | Status: DC | PRN
  Filled 2023-05-19 – 2023-05-22 (×2): qty 230, 77d supply, fill #0

## 2023-05-21 ENCOUNTER — Other Ambulatory Visit (HOSPITAL_BASED_OUTPATIENT_CLINIC_OR_DEPARTMENT_OTHER): Payer: Self-pay

## 2023-05-22 ENCOUNTER — Other Ambulatory Visit (HOSPITAL_BASED_OUTPATIENT_CLINIC_OR_DEPARTMENT_OTHER): Payer: Self-pay

## 2023-05-22 MED ORDER — ATORVASTATIN CALCIUM 80 MG PO TABS
80.0000 mg | ORAL_TABLET | Freq: Every day | ORAL | 1 refills | Status: DC
  Filled 2023-05-22: qty 90, 90d supply, fill #0
  Filled 2023-08-21: qty 90, 90d supply, fill #1

## 2023-06-09 ENCOUNTER — Other Ambulatory Visit (HOSPITAL_BASED_OUTPATIENT_CLINIC_OR_DEPARTMENT_OTHER): Payer: Self-pay

## 2023-07-06 DIAGNOSIS — E1169 Type 2 diabetes mellitus with other specified complication: Secondary | ICD-10-CM | POA: Diagnosis not present

## 2023-07-06 DIAGNOSIS — I10 Essential (primary) hypertension: Secondary | ICD-10-CM | POA: Diagnosis not present

## 2023-07-06 DIAGNOSIS — I2510 Atherosclerotic heart disease of native coronary artery without angina pectoris: Secondary | ICD-10-CM | POA: Diagnosis not present

## 2023-07-06 DIAGNOSIS — D509 Iron deficiency anemia, unspecified: Secondary | ICD-10-CM | POA: Diagnosis not present

## 2023-07-06 DIAGNOSIS — E785 Hyperlipidemia, unspecified: Secondary | ICD-10-CM | POA: Diagnosis not present

## 2023-07-06 DIAGNOSIS — N401 Enlarged prostate with lower urinary tract symptoms: Secondary | ICD-10-CM | POA: Diagnosis not present

## 2023-07-09 ENCOUNTER — Other Ambulatory Visit (HOSPITAL_BASED_OUTPATIENT_CLINIC_OR_DEPARTMENT_OTHER): Payer: Self-pay

## 2023-07-10 ENCOUNTER — Other Ambulatory Visit: Payer: Self-pay

## 2023-07-10 ENCOUNTER — Other Ambulatory Visit (HOSPITAL_BASED_OUTPATIENT_CLINIC_OR_DEPARTMENT_OTHER): Payer: Self-pay

## 2023-07-10 MED ORDER — ALPRAZOLAM 0.25 MG PO TABS
0.2500 mg | ORAL_TABLET | Freq: Three times a day (TID) | ORAL | 0 refills | Status: DC | PRN
  Filled 2023-07-13 – 2023-08-04 (×2): qty 270, 90d supply, fill #0

## 2023-07-13 ENCOUNTER — Other Ambulatory Visit (HOSPITAL_BASED_OUTPATIENT_CLINIC_OR_DEPARTMENT_OTHER): Payer: Self-pay

## 2023-07-13 DIAGNOSIS — I700 Atherosclerosis of aorta: Secondary | ICD-10-CM | POA: Diagnosis not present

## 2023-07-13 DIAGNOSIS — E1169 Type 2 diabetes mellitus with other specified complication: Secondary | ICD-10-CM | POA: Diagnosis not present

## 2023-07-13 DIAGNOSIS — R82998 Other abnormal findings in urine: Secondary | ICD-10-CM | POA: Diagnosis not present

## 2023-07-13 DIAGNOSIS — Z1331 Encounter for screening for depression: Secondary | ICD-10-CM | POA: Diagnosis not present

## 2023-07-13 DIAGNOSIS — I2510 Atherosclerotic heart disease of native coronary artery without angina pectoris: Secondary | ICD-10-CM | POA: Diagnosis not present

## 2023-07-13 DIAGNOSIS — F419 Anxiety disorder, unspecified: Secondary | ICD-10-CM | POA: Diagnosis not present

## 2023-07-13 DIAGNOSIS — Z8619 Personal history of other infectious and parasitic diseases: Secondary | ICD-10-CM | POA: Diagnosis not present

## 2023-07-13 DIAGNOSIS — Z0000 Encounter for general adult medical examination without abnormal findings: Secondary | ICD-10-CM | POA: Diagnosis not present

## 2023-07-13 DIAGNOSIS — I272 Pulmonary hypertension, unspecified: Secondary | ICD-10-CM | POA: Diagnosis not present

## 2023-07-13 DIAGNOSIS — Z1212 Encounter for screening for malignant neoplasm of rectum: Secondary | ICD-10-CM | POA: Diagnosis not present

## 2023-07-13 DIAGNOSIS — Z1339 Encounter for screening examination for other mental health and behavioral disorders: Secondary | ICD-10-CM | POA: Diagnosis not present

## 2023-07-13 DIAGNOSIS — E785 Hyperlipidemia, unspecified: Secondary | ICD-10-CM | POA: Diagnosis not present

## 2023-07-13 DIAGNOSIS — D509 Iron deficiency anemia, unspecified: Secondary | ICD-10-CM | POA: Diagnosis not present

## 2023-07-13 DIAGNOSIS — I719 Aortic aneurysm of unspecified site, without rupture: Secondary | ICD-10-CM | POA: Diagnosis not present

## 2023-07-13 DIAGNOSIS — Z6837 Body mass index (BMI) 37.0-37.9, adult: Secondary | ICD-10-CM | POA: Diagnosis not present

## 2023-07-13 DIAGNOSIS — N401 Enlarged prostate with lower urinary tract symptoms: Secondary | ICD-10-CM | POA: Diagnosis not present

## 2023-07-13 DIAGNOSIS — I10 Essential (primary) hypertension: Secondary | ICD-10-CM | POA: Diagnosis not present

## 2023-07-14 DIAGNOSIS — R109 Unspecified abdominal pain: Secondary | ICD-10-CM | POA: Diagnosis not present

## 2023-08-04 ENCOUNTER — Other Ambulatory Visit (HOSPITAL_BASED_OUTPATIENT_CLINIC_OR_DEPARTMENT_OTHER): Payer: Self-pay

## 2023-08-04 ENCOUNTER — Other Ambulatory Visit: Payer: Self-pay

## 2023-08-07 ENCOUNTER — Other Ambulatory Visit: Payer: Self-pay

## 2023-08-07 ENCOUNTER — Other Ambulatory Visit (HOSPITAL_BASED_OUTPATIENT_CLINIC_OR_DEPARTMENT_OTHER): Payer: Self-pay

## 2023-08-08 ENCOUNTER — Other Ambulatory Visit (HOSPITAL_BASED_OUTPATIENT_CLINIC_OR_DEPARTMENT_OTHER): Payer: Self-pay

## 2023-08-08 MED ORDER — PANTOPRAZOLE SODIUM 40 MG PO TBEC
40.0000 mg | DELAYED_RELEASE_TABLET | Freq: Two times a day (BID) | ORAL | 3 refills | Status: AC
  Filled 2023-08-08 – 2023-08-11 (×8): qty 180, 90d supply, fill #0
  Filled 2023-08-17: qty 60, 30d supply, fill #0
  Filled 2023-09-12 (×2): qty 180, 90d supply, fill #1
  Filled 2023-12-11: qty 180, 90d supply, fill #2
  Filled 2024-03-10: qty 180, 90d supply, fill #3
  Filled 2024-06-02: qty 120, 60d supply, fill #4

## 2023-08-09 ENCOUNTER — Other Ambulatory Visit (HOSPITAL_BASED_OUTPATIENT_CLINIC_OR_DEPARTMENT_OTHER): Payer: Self-pay

## 2023-08-10 ENCOUNTER — Other Ambulatory Visit (HOSPITAL_BASED_OUTPATIENT_CLINIC_OR_DEPARTMENT_OTHER): Payer: Self-pay

## 2023-08-11 ENCOUNTER — Other Ambulatory Visit (HOSPITAL_BASED_OUTPATIENT_CLINIC_OR_DEPARTMENT_OTHER): Payer: Self-pay

## 2023-08-14 ENCOUNTER — Other Ambulatory Visit (HOSPITAL_BASED_OUTPATIENT_CLINIC_OR_DEPARTMENT_OTHER): Payer: Self-pay

## 2023-08-16 ENCOUNTER — Other Ambulatory Visit: Payer: Self-pay

## 2023-08-16 ENCOUNTER — Other Ambulatory Visit (HOSPITAL_BASED_OUTPATIENT_CLINIC_OR_DEPARTMENT_OTHER): Payer: Self-pay

## 2023-08-17 ENCOUNTER — Other Ambulatory Visit (HOSPITAL_BASED_OUTPATIENT_CLINIC_OR_DEPARTMENT_OTHER): Payer: Self-pay

## 2023-08-22 ENCOUNTER — Other Ambulatory Visit (HOSPITAL_BASED_OUTPATIENT_CLINIC_OR_DEPARTMENT_OTHER): Payer: Self-pay

## 2023-09-11 ENCOUNTER — Other Ambulatory Visit: Payer: Self-pay

## 2023-09-11 ENCOUNTER — Other Ambulatory Visit (HOSPITAL_BASED_OUTPATIENT_CLINIC_OR_DEPARTMENT_OTHER): Payer: Self-pay

## 2023-09-11 MED ORDER — OZEMPIC (1 MG/DOSE) 4 MG/3ML ~~LOC~~ SOPN
1.0000 mg | PEN_INJECTOR | SUBCUTANEOUS | 2 refills | Status: DC
Start: 2023-09-11 — End: 2024-03-05
  Filled 2023-09-11: qty 3, 28d supply, fill #0
  Filled 2023-11-08: qty 3, 28d supply, fill #1
  Filled 2024-01-08: qty 3, 28d supply, fill #2

## 2023-09-11 MED ORDER — METFORMIN HCL 1000 MG PO TABS
1000.0000 mg | ORAL_TABLET | Freq: Two times a day (BID) | ORAL | 3 refills | Status: AC
  Filled 2023-09-11: qty 180, 90d supply, fill #0
  Filled 2023-12-09: qty 180, 90d supply, fill #1
  Filled 2024-03-06: qty 180, 90d supply, fill #2
  Filled 2024-06-03: qty 180, 90d supply, fill #3

## 2023-09-12 ENCOUNTER — Other Ambulatory Visit (HOSPITAL_BASED_OUTPATIENT_CLINIC_OR_DEPARTMENT_OTHER): Payer: Self-pay

## 2023-09-12 MED ORDER — ALPRAZOLAM 0.25 MG PO TABS
0.2500 mg | ORAL_TABLET | Freq: Three times a day (TID) | ORAL | 0 refills | Status: AC | PRN
  Filled 2023-09-12 – 2023-09-25 (×2): qty 45, 15d supply, fill #0

## 2023-09-18 DIAGNOSIS — M65332 Trigger finger, left middle finger: Secondary | ICD-10-CM | POA: Diagnosis not present

## 2023-09-25 ENCOUNTER — Other Ambulatory Visit (HOSPITAL_BASED_OUTPATIENT_CLINIC_OR_DEPARTMENT_OTHER): Payer: Self-pay

## 2023-11-08 ENCOUNTER — Other Ambulatory Visit: Payer: Self-pay

## 2023-11-08 ENCOUNTER — Other Ambulatory Visit (HOSPITAL_BASED_OUTPATIENT_CLINIC_OR_DEPARTMENT_OTHER): Payer: Self-pay

## 2023-11-08 MED ORDER — VALSARTAN 40 MG PO TABS
40.0000 mg | ORAL_TABLET | Freq: Every day | ORAL | 3 refills | Status: AC
  Filled 2023-11-08: qty 90, 90d supply, fill #0
  Filled 2024-02-08: qty 90, 90d supply, fill #1
  Filled 2024-05-08: qty 90, 90d supply, fill #2

## 2023-11-09 ENCOUNTER — Other Ambulatory Visit (HOSPITAL_BASED_OUTPATIENT_CLINIC_OR_DEPARTMENT_OTHER): Payer: Self-pay

## 2023-11-13 ENCOUNTER — Other Ambulatory Visit (HOSPITAL_BASED_OUTPATIENT_CLINIC_OR_DEPARTMENT_OTHER): Payer: Self-pay

## 2023-11-14 ENCOUNTER — Other Ambulatory Visit (HOSPITAL_BASED_OUTPATIENT_CLINIC_OR_DEPARTMENT_OTHER): Payer: Self-pay

## 2023-11-14 DIAGNOSIS — E669 Obesity, unspecified: Secondary | ICD-10-CM | POA: Diagnosis not present

## 2023-11-14 DIAGNOSIS — I10 Essential (primary) hypertension: Secondary | ICD-10-CM | POA: Diagnosis not present

## 2023-11-14 DIAGNOSIS — I2510 Atherosclerotic heart disease of native coronary artery without angina pectoris: Secondary | ICD-10-CM | POA: Diagnosis not present

## 2023-11-14 DIAGNOSIS — E1169 Type 2 diabetes mellitus with other specified complication: Secondary | ICD-10-CM | POA: Diagnosis not present

## 2023-11-14 MED ORDER — ATORVASTATIN CALCIUM 80 MG PO TABS
80.0000 mg | ORAL_TABLET | Freq: Every day | ORAL | 2 refills | Status: AC
  Filled 2023-11-14: qty 90, 90d supply, fill #0
  Filled 2024-02-13: qty 90, 90d supply, fill #1
  Filled 2024-05-13: qty 90, 90d supply, fill #2

## 2023-11-19 ENCOUNTER — Ambulatory Visit
Admission: EM | Admit: 2023-11-19 | Discharge: 2023-11-19 | Disposition: A | Discharge status: Home / Self Care | Attending: Internal Medicine | Admitting: Internal Medicine

## 2023-11-19 DIAGNOSIS — J069 Acute upper respiratory infection, unspecified: Secondary | ICD-10-CM

## 2023-11-19 DIAGNOSIS — L089 Local infection of the skin and subcutaneous tissue, unspecified: Secondary | ICD-10-CM | POA: Diagnosis not present

## 2023-11-19 DIAGNOSIS — L720 Epidermal cyst: Secondary | ICD-10-CM

## 2023-11-19 MED ORDER — PREDNISONE 20 MG PO TABS: 40.0000 mg | ORAL_TABLET | Freq: Every day | ORAL | 0 refills | Status: AC

## 2023-11-19 MED ORDER — AMOXICILLIN-POT CLAVULANATE 875-125 MG PO TABS: 1.0000 | ORAL_TABLET | Freq: Two times a day (BID) | ORAL | 0 refills | Status: AC

## 2023-11-19 MED ORDER — PROMETHAZINE-DM 6.25-15 MG/5ML PO SYRP: 5.0000 mL | ORAL_SOLUTION | Freq: Three times a day (TID) | ORAL | 0 refills | Status: AC | PRN

## 2023-11-19 NOTE — Discharge Instructions (Addendum)
 Symptoms and physical exam findings are consistent with an upper respiratory infection.  Given the duration of time we will treat this with antibiotics as well as cough medication and medication to reduce inflammation in the airways.  We will treat this with the following: Augmentin  875/125 mg 1 tablet 2 times daily for 10 days.  This is an antibiotic.  Take this with food. Prednisone  40 mg (2 tablets) once daily for 5 days. Take this in the morning.  This is a steroid to help with inflammation and pain. Promethazine  DM 5 mL every 8 hours as needed for cough.  Use caution as this medication can cause drowsiness. Make sure to stay hydrated.  Drink plenty of fluids even if you do not feel like taking in much solid food. Return to urgent care if symptoms fail to resolve or worsen.  The area on the left lower back is an infected epidermal inclusion cyst.  Incision and drainage done of this area today.  Packing has been placed into the wound and a dry dressing applied.  Recommend removing this packing tomorrow morning and may shower and wash the area with soap and water.  Recommend attempting to repack the wound and cover with a dry dressing.  Change the dressing daily and continue to do packing daily until there is no longer space to pack.  As this is a cyst, it will likely recur with a small knot in the area.  More formal excision would be required by either a dermatologist or general surgeon but is a procedure that could be performed in our offices.  The antibiotic prescribed above should cover the infection associated with the cyst although draining the cyst is normally curative for the infectious process.  This area may take 2 weeks to completely heal.  Return to urgent care if there is concerns.

## 2023-11-19 NOTE — ED Triage Notes (Signed)
 Patient reports chest congestion with cough x1 month. Patient has been taking OTC medication. Patient reports abscess on his left side.

## 2023-11-19 NOTE — ED Provider Notes (Signed)
 [facility]    CSN: [id number] Arrival date & time: 11/19/23  1359      History   Chief Complaint Chief Complaint  Patient presents with   Nasal Congestion   Cough    HPI Connor Wright is a 73 y.o. male.   73 year old male who presents urgent care with complaints of persistent cough as well as a left lower back abscess.  He reports that he has been having significant coughing for the last 3 weeks at least.  He has tried over-the-counter medications but they do not help.  He did have some congestion initially but he does not have it now.  He denies any chest pain, shortness of breath, fevers, chills, sore throat, poor appetite.  He has had similar symptoms in November of last year.  He also relates that he has developed an possible abscess on the left lower back.  He reports he has had a small knot in this area for quite some time but in the last 4 to 5 days this area has become extremely tender, swollen and red.  He does have a history of having a cyst excised from the upper part of his back.  He denies any drainage from the area.   Cough Associated symptoms: no chest pain, no chills, no ear pain, no fever, no rash, no shortness of breath and no sore throat     Past Medical History:  Diagnosis Date   Anxiety    Coronary artery disease    GERD (gastroesophageal reflux disease)    High cholesterol    Type II diabetes mellitus (HCC)     Patient Active Problem List   Diagnosis Date Noted   Tachycardia 02/03/2023   Thoracic aortic aneurysm (HCC) 02/03/2023   Chest pain 12/21/2017   Smoker 11/20/2017   Diabetes mellitus type 2, noninsulin dependent (HCC) 11/20/2017   CAD S/P percutaneous coronary angioplasty 11/09/2017   Essential hypertension 11/01/2017   Dyslipidemia 11/01/2017   Family history of heart disease 11/01/2017   Exertional angina (HCC) 11/01/2017    Past Surgical History:  Procedure Laterality Date   CATARACT EXTRACTION W/ INTRAOCULAR  LENS  IMPLANT, BILATERAL Bilateral    CORONARY ANGIOPLASTY WITH STENT PLACEMENT  11/09/2017   CORONARY STENT INTERVENTION N/A 11/09/2017   Procedure: CORONARY STENT INTERVENTION;  Surgeon: Court Dorn PARAS, MD;  Location: [facility];  Service: Cardiovascular;  Laterality: N/A;   EYE SURGERY     FRACTURE SURGERY     KNEE ARTHROSCOPY Left    LEFT HEART CATH AND CORONARY ANGIOGRAPHY N/A 11/09/2017   Procedure: LEFT HEART CATH AND CORONARY ANGIOGRAPHY;  Surgeon: Court Dorn PARAS, MD;  Location: [facility];  Service: Cardiovascular;  Laterality: N/A;   ORIF SHOULDER FRACTURE Left ~ 1983   PARS PLANA VITRECTOMY W/ REPAIR OF MACULAR HOLE Bilateral        Home Medications    Prior to Admission medications   Medication Sig Start Date End Date Taking? Authorizing Provider  ALPRAZolam  (XANAX ) 0.25 MG tablet Take 1 tablet (0.25 mg total) by mouth 3 (three) times daily as needed for anxiety. Caution with sedation, try to wean down. 09/12/23  Yes   Artificial Tear Ointment (DRY EYES OP) Place 1 drop into both eyes daily as needed (for dry eyes).   Yes [provider]  atorvastatin  (LIPITOR ) 80 MG tablet Take 1 tablet (80 mg total) by mouth daily. 11/14/23  Yes   clopidogrel  (PLAVIX ) 75 MG tablet Take 1  tablet by mouth daily 01/13/23  Yes   ezetimibe  (ZETIA ) 10 MG tablet Take 1 tablet (10 mg total) by mouth daily. 02/22/23  Yes Court Dorn PARAS, MD  Insulin  Pen Needle (NOVOFINE PLUS PEN NEEDLE) 32G X 4 MM MISC Use as directed for 90 days 12/02/21  Yes Cleotilde Bolus, RPH-CPP  metFORMIN  (GLUCOPHAGE ) 1000 MG tablet Take 1 tablet (1,000 mg total) by mouth 2 (two) times daily with a meal. 09/11/23  Yes   metoprolol  succinate (TOPROL  XL) 50 MG 24 hr tablet Take 1.5 tablets (75 mg total) by mouth at bedtime. 02/10/23  Yes Court Dorn PARAS, MD  nitroGLYCERIN  (NITROSTAT ) 0.4 MG SL tablet Place 0.4 mg under the tongue every 5 (five) minutes as needed for chest pain.  10/31/17  Yes [provider]  pantoprazole  (PROTONIX ) 40 MG tablet Take 1 tablet (40 mg total) by mouth 2 (two) times daily. 08/08/23  Yes   Semaglutide , 1 MG/DOSE, (OZEMPIC , 1 MG/DOSE,) 4 MG/3ML SOPN Inject 1 mg into the skin once a week. 09/11/23  Yes   tamsulosin  (FLOMAX ) 0.4 MG CAPS capsule Take 1 capsule (0.4 mg total) by mouth daily. 04/17/23  Yes   valsartan  (DIOVAN ) 40 MG tablet Take 1 tablet (40 mg total) by mouth daily. 11/08/23  Yes   amoxicillin -clavulanate (AUGMENTIN ) 875-125 MG tablet Take 1 tablet by mouth 2 (two) times daily. 11/19/23   Teresa Almarie LABOR, PA-C  COMIRNATY syringe  02/16/23   [provider]  ferrous sulfate  (FEROSUL) 325 (65 FE) MG tablet Take 1 tablet (325 mg total) by mouth daily. 04/28/23     pantoprazole  (PROTONIX ) 40 MG tablet Take 1 tablet (40 mg total) by mouth daily. 03/22/23     predniSONE  (DELTASONE ) 20 MG tablet Take 2 tablets (40 mg total) by mouth daily with breakfast. 11/19/23   Teresa Almarie LABOR, PA-C  promethazine -dextromethorphan (PROMETHAZINE -DM) 6.25-15 MG/5ML syrup Take 5 mLs by mouth 3 (three) times daily as needed for cough. 11/19/23   Haeli Gerlich A, PA-C  tadalafil  (CIALIS ) 5 MG tablet Take 1 tablet (5 mg total) by mouth daily. 01/22/23       Family History Family History  Problem Relation Age of Onset   Diabetes Sister     Social History Social History   Tobacco Use   Smoking status: Former    Current packs/day: 0.00    Average packs/day: 1 pack/day for 35.0 years (35.0 ttl pk-yrs)    Types: Cigarettes    Start date: 100    Quit date: 2007    Years since quitting: 18.4   Smokeless tobacco: Never  Vaping Use   Vaping status: Never Used  Substance Use Topics   Alcohol  use: Not Currently   Drug use: Never     Allergies   Patient has no known allergies.   Review of Systems Review of Systems  Constitutional:  Negative for appetite change, chills and fever.  HENT:  Negative for ear pain and sore throat.   Eyes:  Negative for  pain and visual disturbance.  Respiratory:  Positive for cough. Negative for shortness of breath.   Cardiovascular:  Negative for chest pain and palpitations.  Gastrointestinal:  Positive for diarrhea (ON OZEMPIC ). Negative for abdominal pain, nausea and vomiting.  Genitourinary:  Negative for dysuria and hematuria.  Musculoskeletal:  Negative for arthralgias and back pain.  Skin:  Negative for color change and rash.       LEFT SIDE ABSCESS  Neurological:  Negative for seizures and syncope.  All other systems reviewed and are negative.    Physical Exam Triage Vital Signs ED Triage Vitals [11/19/23 1410]  Encounter Vitals Group     BP 116/77     Girls Systolic BP Percentile      Girls Diastolic BP Percentile      Boys Systolic BP Percentile      Boys Diastolic BP Percentile      Pulse Rate (!) 112     Resp 18     Temp 98.4 F (36.9 C)     Temp Source Oral     SpO2 94 %     Weight      Height      Head Circumference      Peak Flow      Pain Score      Pain Loc      Pain Education      Exclude from Growth Chart    No data found.  Updated Vital Signs BP 116/77 (BP Location: Left Arm)   Pulse (!) 112   Temp 98.4 F (36.9 C) (Oral)   Resp 18   SpO2 94%   Visual Acuity Right Eye Distance:   Left Eye Distance:   Bilateral Distance:    Right Eye Near:   Left Eye Near:    Bilateral Near:     Physical Exam Vitals and nursing note reviewed.  Constitutional:      General: He is not in acute distress.    Appearance: He is well-developed.  HENT:     Head: Normocephalic and atraumatic.     Right Ear: Tympanic membrane normal.     Left Ear: Tympanic membrane normal.     Nose: No congestion.   Eyes:     Conjunctiva/sclera: Conjunctivae normal.    Cardiovascular:     Rate and Rhythm: Normal rate and regular rhythm.     Heart sounds: No murmur heard. Pulmonary:     Effort: Pulmonary effort is normal. No respiratory distress.     Breath sounds: Normal breath  sounds.  Abdominal:     Palpations: Abdomen is soft.     Tenderness: There is no abdominal tenderness.   Musculoskeletal:        General: No swelling.     Cervical back: Neck supple.   Skin:    General: Skin is warm and dry.     Capillary Refill: Capillary refill takes less than 2 seconds.     Findings: Abscess present.       Neurological:     Mental Status: He is alert.   Psychiatric:        Mood and Affect: Mood normal.      UC Treatments / Results  Labs (all labs ordered are listed, but only abnormal results are displayed) Labs Reviewed - No data to display  EKG   Radiology No results found.  Procedures Incision and Drainage  Date/Time: 11/19/2023 3:04 PM  Performed by: Teresa Almarie LABOR, PA-C Authorized by: Teresa Almarie LABOR, PA-C   Consent:    Consent obtained:  Verbal   Consent given by:  Patient   Risks discussed:  Bleeding, incomplete drainage, pain and damage to other organs   Alternatives discussed:  No treatment Universal protocol:    Procedure explained and questions answered to patient or proxy's satisfaction: yes     Relevant documents present and verified: yes     Site/side marked: yes     Immediately prior to procedure, a time out was called:  yes     Patient identity confirmed:  Verbally with patient Location:    Type:  Abscess   Location:  Trunk   Trunk location:  Back (left lower lateral) Pre-procedure details:    Skin preparation:  Betadine Anesthesia:    Anesthesia method:  Local infiltration   Local anesthetic:  Lidocaine  1% w/o epi Procedure type:    Complexity:  Complex Procedure details:    Incision types:  Single straight   Incision depth:  Subcutaneous   Wound management:  Probed and deloculated, irrigated with saline and extensive cleaning   Drainage:  Purulent   Drainage amount:  Copious   Wound treatment:  Wound left open   Packing materials:  1/4 in iodoform gauze Post-procedure details:    Procedure completion:   Tolerated well, no immediate complications  (including critical care time)  Medications Ordered in UC Medications - No data to display  Initial Impression / Assessment and Plan / UC Course  I have reviewed the triage vital signs and the nursing notes.  Pertinent labs & imaging results that were available during my care of the patient were reviewed by me and considered in my medical decision making (see chart for details).     Infected epidermoid cyst  Acute upper respiratory infection   Symptoms and physical exam findings are consistent with an upper respiratory infection.  Given the duration of time we will treat this with antibiotics as well as cough medication and medication to reduce inflammation in the airways.  We will treat this with the following: Augmentin  875/125 mg 1 tablet 2 times daily for 10 days.  This is an antibiotic.  Take this with food. Prednisone  40 mg (2 tablets) once daily for 5 days. Take this in the morning.  This is a steroid to help with inflammation and pain. Promethazine  DM 5 mL every 8 hours as needed for cough.  Use caution as this medication can cause drowsiness. Make sure to stay hydrated.  Drink plenty of fluids even if you do not feel like taking in much solid food. Return to urgent care if symptoms fail to resolve or worsen.  The area on the left lower back is an infected epidermal inclusion cyst.  Incision and drainage done of this area today.  Packing has been placed into the wound and a dry dressing applied.  Recommend removing this packing tomorrow morning and may shower and wash the area with soap and water.  Recommend attempting to repack the wound and cover with a dry dressing.  Change the dressing daily and continue to do packing daily until there is no longer space to pack.  As this is a cyst, it will likely recur with a small knot in the area.  More formal excision would be required by either a dermatologist or general surgeon but is a procedure  that could be performed in our offices.  The antibiotic prescribed above should cover the infection associated with the cyst although draining the cyst is normally curative for the infectious process.  This area may take 2 weeks to completely heal.  Return to urgent care if there is concerns.  Final Clinical Impressions(s) / UC Diagnoses   Final diagnoses:  Infected epidermoid cyst  Acute upper respiratory infection     Discharge Instructions      Symptoms and physical exam findings are consistent with an upper respiratory infection.  Given the duration of time we will treat this with antibiotics as well as cough medication and  medication to reduce inflammation in the airways.  We will treat this with the following: Augmentin  875/125 mg 1 tablet 2 times daily for 10 days.  This is an antibiotic.  Take this with food. Prednisone  40 mg (2 tablets) once daily for 5 days. Take this in the morning.  This is a steroid to help with inflammation and pain. Promethazine  DM 5 mL every 8 hours as needed for cough.  Use caution as this medication can cause drowsiness. Make sure to stay hydrated.  Drink plenty of fluids even if you do not feel like taking in much solid food. Return to urgent care if symptoms fail to resolve or worsen.  The area on the left lower back is an infected epidermal inclusion cyst.  Incision and drainage done of this area today.  Packing has been placed into the wound and a dry dressing applied.  Recommend removing this packing tomorrow morning and may shower and wash the area with soap and water.  Recommend attempting to repack the wound and cover with a dry dressing.  Change the dressing daily and continue to do packing daily until there is no longer space to pack.  As this is a cyst, it will likely recur with a small knot in the area.  More formal excision would be required by either a dermatologist or general surgeon but is a procedure that could be performed in our offices.  The  antibiotic prescribed above should cover the infection associated with the cyst although draining the cyst is normally curative for the infectious process.  This area may take 2 weeks to completely heal.  Return to urgent care if there is concerns.     ED Prescriptions     Medication Sig Dispense Auth. Provider   promethazine -dextromethorphan (PROMETHAZINE -DM) 6.25-15 MG/5ML syrup Take 5 mLs by mouth 3 (three) times daily as needed for cough. 200 mL Latrell Potempa A, PA-C   predniSONE  (DELTASONE ) 20 MG tablet Take 2 tablets (40 mg total) by mouth daily with breakfast. 10 tablet Rip Hawes A, PA-C   amoxicillin -clavulanate (AUGMENTIN ) 875-125 MG tablet Take 1 tablet by mouth 2 (two) times daily. 20 tablet Teresa Almarie LABOR, [place]      PDMP not reviewed this encounter.   Teresa Almarie LABOR, PA-C 11/19/23 [phone]

## 2023-12-09 ENCOUNTER — Other Ambulatory Visit (HOSPITAL_BASED_OUTPATIENT_CLINIC_OR_DEPARTMENT_OTHER): Payer: Self-pay

## 2023-12-09 ENCOUNTER — Other Ambulatory Visit: Payer: Self-pay | Admitting: Cardiovascular Disease

## 2023-12-11 ENCOUNTER — Other Ambulatory Visit: Payer: Self-pay

## 2023-12-11 ENCOUNTER — Other Ambulatory Visit (HOSPITAL_BASED_OUTPATIENT_CLINIC_OR_DEPARTMENT_OTHER): Payer: Self-pay

## 2023-12-11 MED ORDER — TADALAFIL 5 MG PO TABS
5.0000 mg | ORAL_TABLET | Freq: Every day | ORAL | 3 refills | Status: AC
  Filled 2023-12-11: qty 90, 90d supply, fill #0
  Filled 2024-03-29: qty 90, 90d supply, fill #1
  Filled 2024-06-25: qty 90, 90d supply, fill #2

## 2023-12-12 ENCOUNTER — Other Ambulatory Visit (HOSPITAL_BASED_OUTPATIENT_CLINIC_OR_DEPARTMENT_OTHER): Payer: Self-pay

## 2023-12-12 MED ORDER — EZETIMIBE 10 MG PO TABS
10.0000 mg | ORAL_TABLET | Freq: Every day | ORAL | 0 refills | Status: DC
  Filled 2023-12-12 – 2024-02-13 (×2): qty 90, 90d supply, fill #0

## 2023-12-12 MED ORDER — ALPRAZOLAM 0.25 MG PO TABS
0.2500 mg | ORAL_TABLET | Freq: Three times a day (TID) | ORAL | 0 refills | Status: AC
  Filled 2023-12-12: qty 270, 90d supply, fill #0

## 2023-12-13 ENCOUNTER — Other Ambulatory Visit (HOSPITAL_COMMUNITY): Payer: Self-pay

## 2024-01-08 ENCOUNTER — Other Ambulatory Visit (HOSPITAL_BASED_OUTPATIENT_CLINIC_OR_DEPARTMENT_OTHER): Payer: Self-pay

## 2024-01-08 ENCOUNTER — Ambulatory Visit (HOSPITAL_BASED_OUTPATIENT_CLINIC_OR_DEPARTMENT_OTHER)
Admission: RE | Admit: 2024-01-08 | Discharge: 2024-01-08 | Disposition: A | Discharge status: Home / Self Care | Payer: PPO | Source: Ambulatory Visit | Attending: Cardiovascular Disease | Admitting: Cardiovascular Disease

## 2024-01-08 DIAGNOSIS — R911 Solitary pulmonary nodule: Secondary | ICD-10-CM | POA: Diagnosis not present

## 2024-01-08 DIAGNOSIS — I517 Cardiomegaly: Secondary | ICD-10-CM | POA: Diagnosis not present

## 2024-01-08 DIAGNOSIS — Z9861 Coronary angioplasty status: Secondary | ICD-10-CM | POA: Insufficient documentation

## 2024-01-08 DIAGNOSIS — I10 Essential (primary) hypertension: Secondary | ICD-10-CM | POA: Diagnosis not present

## 2024-01-08 DIAGNOSIS — I2510 Atherosclerotic heart disease of native coronary artery without angina pectoris: Secondary | ICD-10-CM | POA: Diagnosis not present

## 2024-01-08 DIAGNOSIS — I712 Thoracic aortic aneurysm, without rupture, unspecified: Secondary | ICD-10-CM | POA: Diagnosis not present

## 2024-01-08 DIAGNOSIS — I7121 Aneurysm of the ascending aorta, without rupture: Secondary | ICD-10-CM | POA: Diagnosis not present

## 2024-01-08 DIAGNOSIS — E785 Hyperlipidemia, unspecified: Secondary | ICD-10-CM | POA: Insufficient documentation

## 2024-01-08 DIAGNOSIS — I700 Atherosclerosis of aorta: Secondary | ICD-10-CM | POA: Diagnosis not present

## 2024-01-08 LAB — POCT I-STAT CREATININE: Creatinine, Ser: 0.9 mg/dL (ref 0.61–1.24)

## 2024-01-08 MED ORDER — IOHEXOL 350 MG/ML SOLN
100.0000 mL | Freq: Once | INTRAVENOUS | Status: AC | PRN
  Administered 2024-01-08 (×2): 80 mL via INTRAVENOUS

## 2024-01-11 ENCOUNTER — Other Ambulatory Visit (HOSPITAL_BASED_OUTPATIENT_CLINIC_OR_DEPARTMENT_OTHER): Payer: Self-pay

## 2024-01-11 DIAGNOSIS — H1031 Unspecified acute conjunctivitis, right eye: Secondary | ICD-10-CM | POA: Diagnosis not present

## 2024-01-11 MED ORDER — POLYMYXIN B-TRIMETHOPRIM 10000-0.1 UNIT/ML-% OP SOLN
1.0000 [drp] | Freq: Three times a day (TID) | OPHTHALMIC | 1 refills | Status: AC
  Filled 2024-01-11: qty 10, 30d supply, fill #0

## 2024-01-16 ENCOUNTER — Encounter: Payer: Self-pay | Admitting: Cardiovascular Disease

## 2024-01-17 ENCOUNTER — Ambulatory Visit: Payer: Self-pay | Admitting: Cardiovascular Disease

## 2024-01-17 DIAGNOSIS — I10 Essential (primary) hypertension: Secondary | ICD-10-CM

## 2024-01-17 DIAGNOSIS — E785 Hyperlipidemia, unspecified: Secondary | ICD-10-CM

## 2024-01-17 DIAGNOSIS — I712 Thoracic aortic aneurysm, without rupture, unspecified: Secondary | ICD-10-CM

## 2024-01-17 DIAGNOSIS — I2510 Atherosclerotic heart disease of native coronary artery without angina pectoris: Secondary | ICD-10-CM

## 2024-01-20 ENCOUNTER — Other Ambulatory Visit (HOSPITAL_BASED_OUTPATIENT_CLINIC_OR_DEPARTMENT_OTHER): Payer: Self-pay

## 2024-01-22 ENCOUNTER — Other Ambulatory Visit (HOSPITAL_BASED_OUTPATIENT_CLINIC_OR_DEPARTMENT_OTHER): Payer: Self-pay

## 2024-01-22 MED ORDER — CLOPIDOGREL BISULFATE 75 MG PO TABS
75.0000 mg | ORAL_TABLET | Freq: Every day | ORAL | 3 refills | Status: AC
  Filled 2024-01-22: qty 90, 90d supply, fill #0
  Filled 2024-04-08: qty 90, 90d supply, fill #1
  Filled 2024-07-05: qty 90, 90d supply, fill #2

## 2024-02-05 ENCOUNTER — Ambulatory Visit: Attending: Cardiovascular Disease | Admitting: Cardiovascular Disease

## 2024-02-05 ENCOUNTER — Encounter: Payer: Self-pay | Admitting: Cardiovascular Disease

## 2024-02-05 VITALS — BP 120/70 | HR 98 | Ht 65.5 in | Wt 225.0 lb

## 2024-02-05 DIAGNOSIS — E785 Hyperlipidemia, unspecified: Secondary | ICD-10-CM | POA: Diagnosis not present

## 2024-02-05 DIAGNOSIS — M65332 Trigger finger, left middle finger: Secondary | ICD-10-CM | POA: Diagnosis not present

## 2024-02-05 DIAGNOSIS — Z9861 Coronary angioplasty status: Secondary | ICD-10-CM | POA: Diagnosis not present

## 2024-02-05 DIAGNOSIS — I7121 Aneurysm of the ascending aorta, without rupture: Secondary | ICD-10-CM

## 2024-02-05 DIAGNOSIS — I2510 Atherosclerotic heart disease of native coronary artery without angina pectoris: Secondary | ICD-10-CM

## 2024-02-05 DIAGNOSIS — I10 Essential (primary) hypertension: Secondary | ICD-10-CM

## 2024-02-05 NOTE — Assessment & Plan Note (Signed)
 History of essential hypertension blood pressure measured today 120/70.  He is on valsartan  and metoprolol .

## 2024-02-05 NOTE — Assessment & Plan Note (Signed)
 History of dyslipidemia on atorvastatin  and Zetia  lipid profile performed 07/06/2023 revealed a total cholesterol 139, LDL 35 and HDL 37.

## 2024-02-05 NOTE — Assessment & Plan Note (Signed)
 History of CAD status post circumflex stenting by myself 11/09/2017 with a 3.5 mm x 16 mm long Synergy drug eluting stent postdilated to 3.9 mm.  He is completely asymptomatic.

## 2024-02-05 NOTE — Assessment & Plan Note (Signed)
 History of thoracic aortic aneurysm measuring 45 mm by CTA 01/08/2024.  It had remained stable.  This will be repeated on an annual basis.

## 2024-02-05 NOTE — Patient Instructions (Signed)
 Medication Instructions:  Your physician recommends that you continue on your current medications as directed. Please refer to the Current Medication list given to you today.  *If you need a refill on your cardiac medications before your next appointment, please call your pharmacy*  Testing/Procedures: Non-Cardiac CT Angiography (CTA) chest/aorta, is a special type of CT scan that uses a computer to produce multi-dimensional views of major blood vessels throughout the body. In CT angiography, a contrast material is injected through an IV to help visualize the blood vessels **To do in August 2026**  Follow-Up: At [facility], you and your health needs are our priority.  As part of our continuing mission to provide you with exceptional heart care, our providers are all part of one team.  This team includes your primary Cardiologist (physician) and Advanced Practice Providers or APPs (Physician Assistants and Nurse Practitioners) who all work together to provide you with the care you need, when you need it.  Your next appointment:   12 month(s)  Provider:   Dorn Lesches, MD

## 2024-02-05 NOTE — Progress Notes (Signed)
 02/05/2024 Connor Wright   05/24/1951  [id number]  Primary Physician Valentin Skates, DO Primary Cardiologist: Dorn JINNY Lesches MD [facility] [place], [place]  HPI:  Connor Wright is a 73 y.o.    moderately overweight married Caucasian male father of 2 children who works in Industrial/product designer.  He originally was from [place].  He was referred by Dr. Landy for cardiovascular evaluation because of new onset dyspnea and chest pressure.  I last saw him in the office 02/03/2023.  His cardiovascular risk factors include 40 pack years of tobacco abuse having quit 12 years ago, treated diabetes and hyperlipidemia as well as family history of heart disease with 2 brothers who had stents and bypass surgery.  He is never had a heart attack or stroke.  He is noticed increasing dyspnea on exertion and chest pressure with walking up a hill in the last several weeks which sounds fairly typical for angina.   Because of his fairly typical symptoms I elected to proceed directly to outpatient radial diagnostic coronary angiography on 11/09/2017 which revealed a high-grade mid AV groove circumflex stenosis which I stented with a 3.5 x 16 mm long Synergy drug-eluting stent postdilated to 3.9 mm.  His symptoms resolved.     Since I saw him in the office 1 year ago he continues to do well.  He denies chest pain or shortness of breath.  He did go on a [employer] cruise last October from [place] to [place] and is scheduled to go on another cruise next May to [place] and [place].  He did have a CTA of his chest performed 01/08/2024 revealing his ascending thoracic aorta was stable at 45 mm.   Current Meds  Medication Sig   ALPRAZolam  (XANAX ) 0.25 MG tablet Take 1 tablet (0.25 mg total) by mouth 3 (three) times daily as needed for anxiety. Caution with sedation, try to wean down.   ALPRAZolam  (XANAX ) 0.25 MG tablet Take 1 tablet (0.25 mg total) by mouth 3 (three) times daily.   Artificial Tear Ointment (DRY EYES OP) Place 1  drop into both eyes daily as needed (for dry eyes).   atorvastatin  (LIPITOR ) 80 MG tablet Take 1 tablet (80 mg total) by mouth daily.   clopidogrel  (PLAVIX ) 75 MG tablet Take 1 tablet (75 mg total) by mouth daily.   ezetimibe  (ZETIA ) 10 MG tablet Take 1 tablet (10 mg total) by mouth daily.   ferrous sulfate  (FEROSUL) 325 (65 FE) MG tablet Take 1 tablet (325 mg total) by mouth daily.   Insulin  Pen Needle (NOVOFINE PLUS PEN NEEDLE) 32G X 4 MM MISC Use as directed for 90 days   metFORMIN  (GLUCOPHAGE ) 1000 MG tablet Take 1 tablet (1,000 mg total) by mouth 2 (two) times daily with a meal.   metoprolol  succinate (TOPROL  XL) 50 MG 24 hr tablet Take 1.5 tablets (75 mg total) by mouth at bedtime.   nitroGLYCERIN  (NITROSTAT ) 0.4 MG SL tablet Place 0.4 mg under the tongue every 5 (five) minutes as needed for chest pain.    pantoprazole  (PROTONIX ) 40 MG tablet Take 1 tablet (40 mg total) by mouth 2 (two) times daily.   Semaglutide , 1 MG/DOSE, (OZEMPIC , 1 MG/DOSE,) 4 MG/3ML SOPN Inject 1 mg into the skin once a week.   tadalafil  (CIALIS ) 5 MG tablet Take 1 tablet (5 mg total) by mouth daily.   tamsulosin  (FLOMAX ) 0.4 MG CAPS capsule Take 1 capsule (0.4 mg total) by mouth daily.   valsartan  (DIOVAN ) 40 MG tablet Take 1  tablet (40 mg total) by mouth daily.     No Known Allergies  Social History   Socioeconomic History   Marital status: Married    Spouse name: Not on file   Number of children: Not on file   Years of education: Not on file   Highest education level: Not on file  Occupational History   Not on file  Tobacco Use   Smoking status: Former    Current packs/day: 0.00    Average packs/day: 1 pack/day for 35.0 years (35.0 ttl pk-yrs)    Types: Cigarettes    Start date: 49    Quit date: 2007    Years since quitting: 18.6   Smokeless tobacco: Never  Vaping Use   Vaping status: Never Used  Substance and Sexual Activity   Alcohol  use: Not Currently   Drug use: Never   Sexual activity:  Yes  Other Topics Concern   Not on file  Social History Narrative   Not on file   Social Drivers of Health   Financial Resource Strain: Not on file  Food Insecurity: Not on file  Transportation Needs: Not on file  Physical Activity: Not on file  Stress: Not on file  Social Connections: Not on file  Intimate Partner Violence: Not on file     Review of Systems: General: negative for chills, fever, night sweats or weight changes.  Cardiovascular: negative for chest pain, dyspnea on exertion, edema, orthopnea, palpitations, paroxysmal nocturnal dyspnea or shortness of breath Dermatological: negative for rash Respiratory: negative for cough or wheezing Urologic: negative for hematuria Abdominal: negative for nausea, vomiting, diarrhea, bright red blood per rectum, melena, or hematemesis Neurologic: negative for visual changes, syncope, or dizziness All other systems reviewed and are otherwise negative except as noted above.    Blood pressure 120/70, pulse 98, height 5' 5.5 (1.664 m), weight 225 lb (102.1 kg), SpO2 94%.  General appearance: alert and no distress Neck: no adenopathy, no carotid bruit, no JVD, supple, symmetrical, trachea midline, and thyroid  not enlarged, symmetric, no tenderness/mass/nodules Lungs: clear to auscultation bilaterally Heart: regular rate and rhythm, S1, S2 normal, no murmur, click, rub or gallop Extremities: extremities normal, atraumatic, no cyanosis or edema Pulses: 2+ and symmetric Skin: Skin color, texture, turgor normal. No rashes or lesions Neurologic: Grossly normal  EKG EKG Interpretation Date/Time:  Monday February 05 2024 11:15:52 EDT Ventricular Rate:  98 PR Interval:  164 QRS Duration:  98 QT Interval:  354 QTC Calculation: 451 R Axis:   25  Text Interpretation: Normal sinus rhythm Normal ECG When compared with ECG of 03-Feb-2023 11:27, No significant change was found Confirmed by Court Carrier [phone]) on 02/05/2024 11:54:26 AM     ASSESSMENT AND PLAN:   Essential hypertension History of essential hypertension blood pressure measured today 120/70.  He is on valsartan  and metoprolol .  Dyslipidemia History of dyslipidemia on atorvastatin  and Zetia  lipid profile performed 07/06/2023 revealed a total cholesterol 139, LDL 35 and HDL 37.  CAD S/P percutaneous coronary angioplasty History of CAD status post circumflex stenting by myself 11/09/2017 with a 3.5 mm x 16 mm long Synergy drug eluting stent postdilated to 3.9 mm.  He is completely asymptomatic.  Thoracic aortic aneurysm [facility]) History of thoracic aortic aneurysm measuring 45 mm by CTA 01/08/2024.  It had remained stable.  This will be repeated on an annual basis.     Carrier DOROTHA Court MD FACP,FACC,FAHA, [facility] 02/05/2024 12:04 PM

## 2024-02-13 ENCOUNTER — Other Ambulatory Visit (HOSPITAL_BASED_OUTPATIENT_CLINIC_OR_DEPARTMENT_OTHER): Payer: Self-pay

## 2024-02-13 DIAGNOSIS — K219 Gastro-esophageal reflux disease without esophagitis: Secondary | ICD-10-CM | POA: Diagnosis not present

## 2024-02-13 DIAGNOSIS — I10 Essential (primary) hypertension: Secondary | ICD-10-CM | POA: Diagnosis not present

## 2024-02-13 DIAGNOSIS — I2510 Atherosclerotic heart disease of native coronary artery without angina pectoris: Secondary | ICD-10-CM | POA: Diagnosis not present

## 2024-02-13 DIAGNOSIS — F419 Anxiety disorder, unspecified: Secondary | ICD-10-CM | POA: Diagnosis not present

## 2024-02-13 DIAGNOSIS — D509 Iron deficiency anemia, unspecified: Secondary | ICD-10-CM | POA: Diagnosis not present

## 2024-02-13 DIAGNOSIS — R911 Solitary pulmonary nodule: Secondary | ICD-10-CM | POA: Diagnosis not present

## 2024-02-13 DIAGNOSIS — Z6837 Body mass index (BMI) 37.0-37.9, adult: Secondary | ICD-10-CM | POA: Diagnosis not present

## 2024-02-13 DIAGNOSIS — E1169 Type 2 diabetes mellitus with other specified complication: Secondary | ICD-10-CM | POA: Diagnosis not present

## 2024-02-13 DIAGNOSIS — E785 Hyperlipidemia, unspecified: Secondary | ICD-10-CM | POA: Diagnosis not present

## 2024-02-13 DIAGNOSIS — I719 Aortic aneurysm of unspecified site, without rupture: Secondary | ICD-10-CM | POA: Diagnosis not present

## 2024-02-13 DIAGNOSIS — Z8619 Personal history of other infectious and parasitic diseases: Secondary | ICD-10-CM | POA: Diagnosis not present

## 2024-02-15 ENCOUNTER — Other Ambulatory Visit (HOSPITAL_BASED_OUTPATIENT_CLINIC_OR_DEPARTMENT_OTHER): Payer: Self-pay

## 2024-02-15 MED ORDER — AMOXICILLIN-POT CLAVULANATE 875-125 MG PO TABS
1.0000 | ORAL_TABLET | Freq: Two times a day (BID) | ORAL | 0 refills | Status: AC
  Filled 2024-02-15: qty 14, 7d supply, fill #0

## 2024-02-23 ENCOUNTER — Other Ambulatory Visit (HOSPITAL_BASED_OUTPATIENT_CLINIC_OR_DEPARTMENT_OTHER): Payer: Self-pay

## 2024-02-23 MED ORDER — COMIRNATY 30 MCG/0.3ML IM SUSY
0.3000 mL | PREFILLED_SYRINGE | Freq: Once | INTRAMUSCULAR | 0 refills | Status: AC
Start: 2024-02-23 — End: 2024-02-24
  Filled 2024-02-23: qty 0.3, 1d supply, fill #0

## 2024-02-26 ENCOUNTER — Encounter: Payer: Self-pay | Admitting: Cardiovascular Disease

## 2024-03-05 ENCOUNTER — Other Ambulatory Visit: Payer: Self-pay | Admitting: Cardiovascular Disease

## 2024-03-05 ENCOUNTER — Other Ambulatory Visit (HOSPITAL_BASED_OUTPATIENT_CLINIC_OR_DEPARTMENT_OTHER): Payer: Self-pay

## 2024-03-05 DIAGNOSIS — I2510 Atherosclerotic heart disease of native coronary artery without angina pectoris: Secondary | ICD-10-CM

## 2024-03-05 DIAGNOSIS — I10 Essential (primary) hypertension: Secondary | ICD-10-CM

## 2024-03-05 DIAGNOSIS — I712 Thoracic aortic aneurysm, without rupture, unspecified: Secondary | ICD-10-CM

## 2024-03-05 DIAGNOSIS — E785 Hyperlipidemia, unspecified: Secondary | ICD-10-CM

## 2024-03-05 MED ORDER — OZEMPIC (1 MG/DOSE) 4 MG/3ML ~~LOC~~ SOPN
1.0000 mg | PEN_INJECTOR | SUBCUTANEOUS | 2 refills | Status: DC
  Filled 2024-03-05: qty 3, 28d supply, fill #0
  Filled 2024-03-29: qty 3, 28d supply, fill #1
  Filled 2024-04-26: qty 3, 28d supply, fill #2

## 2024-03-06 ENCOUNTER — Other Ambulatory Visit: Payer: Self-pay

## 2024-03-07 ENCOUNTER — Other Ambulatory Visit (HOSPITAL_BASED_OUTPATIENT_CLINIC_OR_DEPARTMENT_OTHER): Payer: Self-pay

## 2024-03-07 ENCOUNTER — Other Ambulatory Visit: Payer: Self-pay

## 2024-03-07 MED ORDER — METOPROLOL SUCCINATE ER 50 MG PO TB24
75.0000 mg | ORAL_TABLET | Freq: Every day | ORAL | 3 refills | Status: AC
  Filled 2024-03-07: qty 135, 90d supply, fill #0
  Filled 2024-06-03: qty 135, 90d supply, fill #1

## 2024-03-11 DIAGNOSIS — E113293 Type 2 diabetes mellitus with mild nonproliferative diabetic retinopathy without macular edema, bilateral: Secondary | ICD-10-CM | POA: Diagnosis not present

## 2024-03-11 DIAGNOSIS — H40053 Ocular hypertension, bilateral: Secondary | ICD-10-CM | POA: Diagnosis not present

## 2024-03-11 DIAGNOSIS — Z961 Presence of intraocular lens: Secondary | ICD-10-CM | POA: Diagnosis not present

## 2024-03-11 DIAGNOSIS — H524 Presbyopia: Secondary | ICD-10-CM | POA: Diagnosis not present

## 2024-03-11 DIAGNOSIS — H52201 Unspecified astigmatism, right eye: Secondary | ICD-10-CM | POA: Diagnosis not present

## 2024-03-14 ENCOUNTER — Other Ambulatory Visit (HOSPITAL_BASED_OUTPATIENT_CLINIC_OR_DEPARTMENT_OTHER): Payer: Self-pay

## 2024-03-14 MED ORDER — HYDROCODONE-ACETAMINOPHEN 5-325 MG PO TABS
1.0000 | ORAL_TABLET | Freq: Four times a day (QID) | ORAL | 0 refills | Status: DC
  Filled 2024-03-14: qty 5, 2d supply, fill #0

## 2024-03-15 DIAGNOSIS — M65332 Trigger finger, left middle finger: Secondary | ICD-10-CM | POA: Diagnosis not present

## 2024-03-26 ENCOUNTER — Telehealth: Payer: Self-pay | Admitting: Cardiovascular Disease

## 2024-03-26 NOTE — Telephone Encounter (Signed)
  Per MyChart scheduling message:   Pt c/o of Chest Pain: STAT if active (IN THIS MOMENT) CP, including tightness, pressure, jaw pain, shoulder/upper arm/back pain, SOB, nausea, and vomiting.  1. Are you having CP right now (tightness, pressure, or discomfort)?   2. Are you experiencing any other symptoms (ex. SOB, nausea, vomiting, sweating)?   3. How long have you been experiencing CP?   4. Is your CP continuous or coming and going?   5. Have you taken Nitroglycerin ?   6. If CP returns before callback, please consider calling 911. ?   I have mild chest pressure that comes and goes for past 2 weeks. I notice some shortness of breath. I have not taken nitroglycerine.

## 2024-03-26 NOTE — Telephone Encounter (Signed)
 Spoke with pt regarding mychart message that he sent to scheduling. Pt states that he is having chest pressure with slight feeling of shortness of breath. Pt did have some chest congestion and cough a few weeks ago but has not being coughing for the last week or so. Pt states that this chest pressure is similar to what he had prior to his stent. Pt states he can re-create the chest pressure is exertional walking and when he works out on Thursdays where he does stepping and stretching. Pt states his pain goes away when he stops the activity and sits down. Pt has not tried taking a nitroglycerin  because his pain resolves relatively quickly. Pt is currently driving and can not provide vitals at this time. Pt scheduled to see Scot Ford, PA on Thursday, 10/30 at 11:20am. Pt verbalizes understanding.

## 2024-03-27 NOTE — Progress Notes (Unsigned)
 Cardiology Office Note:    Date:  03/29/2024   ID:  Connor Wright, DOB 1950/06/11, MRN [id number]  PCP:  Valentin Skates, DO   [facility] Providers Cardiologist:  Dorn Lesches, MD {  Referring MD: Valentin Skates, DO   No chief complaint on file.   History of Present Illness:    Connor Wright is a 73 y.o. male with a hx of coronary artery disease s/p DES to LCx in 10/2017, diabetes, hypertension, hyperlipidemia, previous tobacco use, thoracic aortic aneurysm (45 mm by CTA 12/2023) who presents to clinic today with complaints of chest pressure, shortness of breath.   He follows with Dr. Lesches and was last seen 02/05/2024 and was doing well. At this appointment he had denied any recurrence of chest pain or shortness of breath. His medication regimen at this time included: Lipitor  80 mg daily, Plavix  75 mg daily, Zetia  10 mg daily, Toprol  75 mg daily, valsartan  40 mg daily.   Cardiac workup [facility], 10/2017: Prox Cx to Mid Cx lesion is 95% stenosed, DES was successfully placed. LVEF 55-65%.  Stress test, 11/2017: EF 67%, normal perfusion, low risk study  Today in clinic, he had a cough that lasted more than a week, since cough resolved, shortness of breath with exertion has persisted.  He also complained of intermittent substernal chest pressure however also stated that he previously felt congested.  Both chest pressure and shortness of breath improving, patient is concerned as they were reminiscent of the previous angina.  Given both typical and atypical features, I recommended PET stress test to further assess.  If the PET stress test is normal, patient can follow-up in 1 year as previously recommended.     Past Medical History:  Diagnosis Date   Anxiety    Coronary artery disease    GERD (gastroesophageal reflux disease)    High cholesterol    Type II diabetes mellitus (HCC)    Past Surgical History:  Procedure Laterality Date   CATARACT EXTRACTION W/  INTRAOCULAR LENS  IMPLANT, BILATERAL Bilateral    CORONARY ANGIOPLASTY WITH STENT PLACEMENT  11/09/2017   CORONARY STENT INTERVENTION N/A 11/09/2017   Procedure: CORONARY STENT INTERVENTION;  Surgeon: Lesches Dorn PARAS, MD;  Location: [facility]IVE [facility];  Service: Cardiovascular;  Laterality: N/A;   EYE SURGERY     FRACTURE SURGERY     KNEE ARTHROSCOPY Left    LEFT HEART CATH AND CORONARY ANGIOGRAPHY N/A 11/09/2017   Procedure: LEFT HEART CATH AND CORONARY ANGIOGRAPHY;  Surgeon: Lesches Dorn PARAS, MD;  Location: [facility]VASIVE CV LAB;  Service: Cardiovascular;  Laterality: N/A;   ORIF SHOULDER FRACTURE Left ~ 1983   PARS PLANA VITRECTOMY W/ REPAIR OF MACULAR HOLE Bilateral    Current Medications: Current Meds  Medication Sig   ALPRAZolam  (XANAX ) 0.25 MG tablet Take 1 tablet (0.25 mg total) by mouth 3 (three) times daily.   Artificial Tear Ointment (DRY EYES OP) Place 1 drop into both eyes daily as needed (for dry eyes).   atorvastatin  (LIPITOR ) 80 MG tablet Take 1 tablet (80 mg total) by mouth daily.   clopidogrel  (PLAVIX ) 75 MG tablet Take 1 tablet (75 mg total) by mouth daily.   ezetimibe  (ZETIA ) 10 MG tablet Take 1 tablet (10 mg total) by mouth daily.   ferrous sulfate  (FEROSUL) 325 (65 FE) MG tablet Take 1 tablet (325 mg total) by mouth daily.   Insulin  Pen Needle (NOVOFINE PLUS PEN NEEDLE) 32G X 4 MM MISC Use as directed for 90  days   metFORMIN  (GLUCOPHAGE ) 1000 MG tablet Take 1 tablet (1,000 mg total) by mouth 2 (two) times daily with a meal.   metoprolol  succinate (TOPROL  XL) 50 MG 24 hr tablet Take 1.5 tablets (75 mg total) by mouth at bedtime.   nitroGLYCERIN  (NITROSTAT ) 0.4 MG SL tablet Place 0.4 mg under the tongue every 5 (five) minutes as needed for chest pain.    pantoprazole  (PROTONIX ) 40 MG tablet Take 1 tablet (40 mg total) by mouth 2 (two) times daily.   Semaglutide , 1 MG/DOSE, (OZEMPIC , 1 MG/DOSE,) 4 MG/3ML SOPN Inject 1 mg into the skin once a week.   tamsulosin  (FLOMAX ) 0.4  MG CAPS capsule Take 1 capsule (0.4 mg total) by mouth daily.   valsartan  (DIOVAN ) 40 MG tablet Take 1 tablet (40 mg total) by mouth daily.    Allergies:   Patient has no known allergies.   Social History   Socioeconomic History   Marital status: Married    Spouse name: Not on file   Number of children: Not on file   Years of education: Not on file   Highest education level: Not on file  Occupational History   Not on file  Tobacco Use   Smoking status: Former    Current packs/day: 0.00    Average packs/day: 1 pack/day for 35.0 years (35.0 ttl pk-yrs)    Types: Cigarettes    Start date: 10    Quit date: 2007    Years since quitting: 18.8   Smokeless tobacco: Never  Vaping Use   Vaping status: Never Used  Substance and Sexual Activity   Alcohol  use: Not Currently   Drug use: Never   Sexual activity: Yes  Other Topics Concern   Not on file  Social History Narrative   Not on file   Social Drivers of Health   Financial Resource Strain: Not on file  Food Insecurity: Not on file  Transportation Needs: Not on file  Physical Activity: Not on file  Stress: Not on file  Social Connections: Not on file    Family History: The patient's family history includes Diabetes in his sister.  ROS:   Please see the history of present illness.     All other systems reviewed and are negative.  EKGs/Labs/Other Studies Reviewed:    The following studies were reviewed today: Cardiac Studies & Procedures   ______________________________________________________________________________________________ CARDIAC CATHETERIZATION  CARDIAC CATHETERIZATION 11/09/2017  Conclusion Images from the original result were not included.   Prox Cx to Mid Cx lesion is 95% stenosed.  A stent was successfully placed.  Post intervention, there is a 0% residual stenosis.  The left ventricular systolic function is normal.  LV end diastolic pressure is normal.  The left ventricular ejection  fraction is 55-65% by visual estimate.  Connor Wright is a 73 y.o. male   [id number] LOCATION:  FACILITY: [facility] PHYSICIAN: Dorn Lesches, M.D. [birth year]   DATE OF PROCEDURE:  11/09/2017  DATE OF DISCHARGE:     CARDIAC CATHETERIZATION / DES LCX    History obtained from chart review.Connor Wright is a 73 y.o. 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  He was referred by Dr. Landy for cardiovascular evaluation because of new onset dyspnea and chest pressure.  Risk factors include 40 pack years of tobacco abuse having quit 12 years ago, treated diabetes and hyperlipidemia as well as family history of heart disease with 2 brothers who had stents and bypass surgery.  He is  never had a heart attack or stroke.  He is noticed increasing dyspnea on exertion and chest pressure with walking up a hill in the last several weeks which sounds fairly typical for angina.  Based on his fairly typical symptoms and risk factor profile I elected to proceed directly to outpatient cardiac catheterization to the right radial approach to define his anatomy and rule out an ischemic etiology.     PROCEDURE DESCRIPTION:  The patient was brought to the second floor [place] [facility]iac cath lab in the postabsorptive state. He was premedicated with IV Versed  and fentanyl . His right wristwas prepped and shaved in usual sterile fashion. Xylocaine  1% was used for local anesthesia. A 6 French sheath was inserted into the right radial artery using standard Seldinger technique. The patient received 5000 units  of heparin  intravenously.  A 5 French TIG catheter and pigtail catheters were used for selective coronary angiography and left ventriculography respectively.  Isovue dye was used for the entirety of the case.  Retrograde aorta, left ventricular and pullback pressures were recorded.  Radial cocktail was administered via the SideArm sheath.  The patient  received 600 mg of p.o. Plavix  followed by Angiomax  bolus and infusion.  He also received 20 mg of IV Pepcid .  Using a 6 French XB 3.5 cm guide catheter along with 0.14 pro-water guidewire and a 2 mm x 12 mm balloon the mid circumflex lesion was predilated.  Following this a 3.5 mm x 16 mm long Synergy drug-eluting stent was then carefully positioned across the diseased segment and deployed at 16 atm.  It was postdilated with a 3.75 mm x 12 mm long noncompliant balloon (3.9 mm) resulting reduction of a 95% mid AV groove circumflex to 0% residual with excellent excellent flow.  The patient tolerated the procedure well.  There were no hemodynamic or electrocardiographic sequela.  The guidewire and catheter were removed.  The sheath was removed and a TR band was placed on the right wrist to achieve patent hemostasis.  The patient left the lab in stable condition.  Impression Successful PCI and stenting of mid circumflex high-grade stenosis with synergy drug-eluting stent with postdilatation diameter of 3.9 mm.  Angiomax  will continue for full 4 hours of full dose.  The patient will be discharged home in the morning on aspirin  and Plavix .  He will remain on dual anti-therapy uninterrupted for 12 months.  I will see him back in the office 2 to 3 weeks after discharge.  Dorn Lesches. MD, FACC 11/09/2017 11:26 AM  Findings Coronary Findings Diagnostic  Dominance: Right  Left Circumflex Prox Cx to Mid Cx lesion is 95% stenosed.  Intervention  Prox Cx to Mid Cx lesion Stent A stent was successfully placed. Post-Intervention Lesion Assessment The intervention was successful. Intentional subintimal strategy was not used. Embolic protection device was not deployed. Pre-interventional TIMI flow is 3. Post-intervention TIMI flow is 3. No complications occurred at this lesion. There is a 0% residual stenosis post intervention.   STRESS TESTS  MYOCARDIAL PERFUSION IMAGING 12/27/2017  Interpretation  Summary  Nuclear stress EF: 67%.  The left ventricular ejection fraction is hyperdynamic (>65%).  Blood pressure demonstrated a normal response to exercise.  No T wave inversion was noted during stress.  There was no ST segment deviation noted during stress.  The study is normal.  This is a low risk study.  Normal perfusion. LVEF 67% with normal wall motion. This is a low risk study.  ______________________________________________________________________________________________       EKG Interpretation Date/Time:  Thursday March 28 2024 12:25:44 EDT Ventricular Rate:  112 PR Interval:  160 QRS Duration:  98 QT Interval:  346 QTC Calculation: 472 R Axis:   59  Text Interpretation: Sinus tachycardia Incomplete right bundle branch block When compared with ECG of 05-Feb-2024 11:15, No significant change was found Confirmed by Janene Boer [phone]) on 03/28/2024 12:28:35 PM    Recent Labs: 01/08/2024: Creatinine, Ser 0.90  Recent Lipid Panel    Component Value Date/Time   CHOL 133 03/29/2022 0907   TRIG 192 (H) 03/29/2022 0907   HDL 41 03/29/2022 0907   CHOLHDL 3.2 03/29/2022 0907   CHOLHDL 4.5 11/10/2017 0720   VLDL 49 (H) 11/10/2017 0720   LDLCALC 60 03/29/2022 0907     Risk Assessment/Calculations:                Physical Exam:    VS:  BP 128/83 (BP Location: Left Arm, Patient Position: Sitting, Cuff Size: Normal)   Pulse (!) 112   Ht 5' 6 (1.676 m)   Wt 224 lb 12.8 oz (102 kg)   SpO2 94%   BMI 36.28 kg/m     Wt Readings from Last 3 Encounters:  03/28/24 224 lb 12.8 oz (102 kg)  02/05/24 225 lb (102.1 kg)  02/03/23 222 lb 9.6 oz (101 kg)     GEN:  Well nourished, well developed in no acute distress HEENT: Normal NECK: No JVD; No carotid bruits LYMPHATICS: No lymphadenopathy CARDIAC: RRR, no murmurs, rubs, gallops RESPIRATORY:  Clear to auscultation without rales, wheezing or rhonchi  ABDOMEN: Soft, non-tender,  non-distended MUSCULOSKELETAL:  No edema; No deformity  SKIN: Warm and dry NEUROLOGIC:  Alert and oriented x 3 PSYCHIATRIC:  Normal affect   ASSESSMENT:    1. Chest pain, unspecified type   2. Coronary artery disease involving native coronary artery of native heart with angina pectoris   3. Essential hypertension   4. Hyperlipidemia LDL goal <70   5. Thoracic aortic aneurysm (TAA), unspecified part, unspecified whether ruptured    PLAN:    In order of problems listed above:  Chest pain Reports chest pressure, shortness of breath with exertion. Relieved by rest Reports pain was similar to prior pain when he required a stent, however at the same time chest discomfort and shortness of breath occurred after viral illness.  Will obtain PET stress test to further assess.  Coronary artery disease s/p DES to LCx 2019 Hyperlipidemia  Home meds: Lipitor  80 mg, Plavix  75 mg, Zetia  10 mg, Toprol  75 mg, valsartan  40 mg LDL 35 as of 07/2023   Hypertension Home meds: Toprol  75 mg, valsartan  40 mg BP today well-controlled   Hyperlipidemia Continue Lipitor .  Thoracic aortic aneurysm   Noted to be 45 mm on CTA 12/2023, stable Continue to monitor with yearly imaging       Informed Consent   Shared Decision Making/Informed Consent The risks [chest pain, shortness of breath, cardiac arrhythmias, dizziness, blood pressure fluctuations, myocardial infarction, stroke/transient ischemic attack, nausea, vomiting, allergic reaction, radiation exposure, metallic taste sensation and life-threatening complications (estimated to be 1 in 10,000)], benefits (risk stratification, diagnosing coronary artery disease, treatment guidance) and alternatives of a cardiac PET stress test were discussed in detail with Connor Wright and he agrees to proceed.       Medication Adjustments/Labs and Tests Ordered: Current medicines are reviewed at length with the patient today.  Concerns regarding medicines are  outlined  above.  Orders Placed This Encounter  Procedures   NM PET CT CARDIAC PERFUSION MULTI W/ABSOLUTE BLOODFLOW   EKG 12-Lead   No orders of the defined types were placed in this encounter.   Patient Instructions  Medication Instructions:   Your physician recommends that you continue on your current medications as directed. Please refer to the Current Medication list given to you today.  *If you need a refill on your cardiac medications before your next appointment, please call your pharmacy*  Lab Work: NONE ORDERED  TODAY    If you have labs (blood work) drawn today and your tests are completely normal, you will receive your results only by: MyChart Message (if you have MyChart) OR A paper copy in the mail If you have any lab test that is abnormal or we need to change your treatment, we will call you to review the results.  Testing/Procedures:  you have been recommended to get  A cardiac PET scan is a medical imaging procedure that uses radioactive tracers to create detailed images of the heart and its blood flow. It helps healthcare professionals diagnose and monitor various heart conditions.     Follow-Up: At [facility], you and your health needs are our priority.  As part of our continuing mission to provide you with exceptional heart care, our providers are all part of one team.  This team includes your primary Cardiologist (physician) and Advanced Practice Providers or APPs (Physician Assistants and Nurse Practitioners) who all work together to provide you with the care you need, when you need it.  Your next appointment:   AS SCHEDULED WITH DR COURT    Provider:  We recommend signing up for the patient portal called MyChart.  Sign up information is provided on this After Visit Summary.  MyChart is used to connect with patients for Virtual Visits (Telemedicine).  Patients are able to view lab/test results, encounter notes, upcoming appointments, etc.  Non-urgent  messages can be sent to your provider as well.   To learn more about what you can do with MyChart, go to [email or web address].   Other Instructions             Signed, Scot Ford, [place]  03/29/2024 10:34 PM     [facility]

## 2024-03-28 ENCOUNTER — Encounter: Payer: Self-pay | Admitting: Physician Assistant

## 2024-03-28 ENCOUNTER — Other Ambulatory Visit (HOSPITAL_BASED_OUTPATIENT_CLINIC_OR_DEPARTMENT_OTHER): Payer: Self-pay

## 2024-03-28 ENCOUNTER — Ambulatory Visit: Attending: Physician Assistant | Admitting: Physician Assistant

## 2024-03-28 VITALS — BP 128/83 | HR 112 | Ht 66.0 in | Wt 224.8 lb

## 2024-03-28 DIAGNOSIS — E785 Hyperlipidemia, unspecified: Secondary | ICD-10-CM

## 2024-03-28 DIAGNOSIS — I712 Thoracic aortic aneurysm, without rupture, unspecified: Secondary | ICD-10-CM | POA: Diagnosis not present

## 2024-03-28 DIAGNOSIS — I25119 Atherosclerotic heart disease of native coronary artery with unspecified angina pectoris: Secondary | ICD-10-CM | POA: Diagnosis not present

## 2024-03-28 DIAGNOSIS — I10 Essential (primary) hypertension: Secondary | ICD-10-CM

## 2024-03-28 DIAGNOSIS — R079 Chest pain, unspecified: Secondary | ICD-10-CM

## 2024-03-28 DIAGNOSIS — R0789 Other chest pain: Secondary | ICD-10-CM

## 2024-03-28 MED ORDER — DOXYCYCLINE HYCLATE 100 MG PO CAPS
100.0000 mg | ORAL_CAPSULE | Freq: Two times a day (BID) | ORAL | 0 refills | Status: AC
  Filled 2024-03-28: qty 28, 14d supply, fill #0

## 2024-03-28 NOTE — Patient Instructions (Addendum)
 Medication Instructions:   Your physician recommends that you continue on your current medications as directed. Please refer to the Current Medication list given to you today.  *If you need a refill on your cardiac medications before your next appointment, please call your pharmacy*  Lab Work: NONE ORDERED  TODAY    If you have labs (blood work) drawn today and your tests are completely normal, you will receive your results only by: MyChart Message (if you have MyChart) OR A paper copy in the mail If you have any lab test that is abnormal or we need to change your treatment, we will call you to review the results.  Testing/Procedures:  you have been recommended to get  A cardiac PET scan is a medical imaging procedure that uses radioactive tracers to create detailed images of the heart and its blood flow. It helps healthcare professionals diagnose and monitor various heart conditions.     Follow-Up: At [facility], you and your health needs are our priority.  As part of our continuing mission to provide you with exceptional heart care, our providers are all part of one team.  This team includes your primary Cardiologist (physician) and Advanced Practice Providers or APPs (Physician Assistants and Nurse Practitioners) who all work together to provide you with the care you need, when you need it.  Your next appointment:   AS SCHEDULED WITH DR COURT    Provider:  We recommend signing up for the patient portal called MyChart.  Sign up information is provided on this After Visit Summary.  MyChart is used to connect with patients for Virtual Visits (Telemedicine).  Patients are able to view lab/test results, encounter notes, upcoming appointments, etc.  Non-urgent messages can be sent to your provider as well.   To learn more about what you can do with MyChart, go to [email or web address].   Other Instructions

## 2024-04-08 ENCOUNTER — Other Ambulatory Visit (HOSPITAL_BASED_OUTPATIENT_CLINIC_OR_DEPARTMENT_OTHER): Payer: Self-pay

## 2024-04-08 ENCOUNTER — Other Ambulatory Visit: Payer: Self-pay

## 2024-04-09 ENCOUNTER — Other Ambulatory Visit (HOSPITAL_BASED_OUTPATIENT_CLINIC_OR_DEPARTMENT_OTHER): Payer: Self-pay

## 2024-04-09 MED ORDER — TAMSULOSIN HCL 0.4 MG PO CAPS
0.4000 mg | ORAL_CAPSULE | Freq: Every day | ORAL | 3 refills | Status: AC
  Filled 2024-04-09 (×2): qty 90, 90d supply, fill #0
  Filled 2024-07-05: qty 90, 90d supply, fill #1

## 2024-04-09 MED ORDER — ALPRAZOLAM 0.25 MG PO TABS
0.2500 mg | ORAL_TABLET | Freq: Three times a day (TID) | ORAL | 0 refills | Status: AC | PRN
  Filled 2024-04-09: qty 270, 90d supply, fill #0

## 2024-04-26 ENCOUNTER — Other Ambulatory Visit (HOSPITAL_BASED_OUTPATIENT_CLINIC_OR_DEPARTMENT_OTHER): Payer: Self-pay

## 2024-05-07 DIAGNOSIS — E669 Obesity, unspecified: Secondary | ICD-10-CM | POA: Diagnosis not present

## 2024-05-07 DIAGNOSIS — E1169 Type 2 diabetes mellitus with other specified complication: Secondary | ICD-10-CM | POA: Diagnosis not present

## 2024-05-07 DIAGNOSIS — I10 Essential (primary) hypertension: Secondary | ICD-10-CM | POA: Diagnosis not present

## 2024-05-07 DIAGNOSIS — I2510 Atherosclerotic heart disease of native coronary artery without angina pectoris: Secondary | ICD-10-CM | POA: Diagnosis not present

## 2024-05-11 ENCOUNTER — Other Ambulatory Visit: Payer: Self-pay | Admitting: Cardiovascular Disease

## 2024-05-14 ENCOUNTER — Other Ambulatory Visit (HOSPITAL_BASED_OUTPATIENT_CLINIC_OR_DEPARTMENT_OTHER): Payer: Self-pay

## 2024-05-14 ENCOUNTER — Other Ambulatory Visit: Payer: Self-pay

## 2024-05-14 MED ORDER — EZETIMIBE 10 MG PO TABS
10.0000 mg | ORAL_TABLET | Freq: Every day | ORAL | 3 refills | Status: AC
  Filled 2024-05-14 (×2): qty 90, 90d supply, fill #0

## 2024-05-27 ENCOUNTER — Other Ambulatory Visit (HOSPITAL_BASED_OUTPATIENT_CLINIC_OR_DEPARTMENT_OTHER): Payer: Self-pay

## 2024-05-27 MED ORDER — FERROUS SULFATE 325 (65 FE) MG PO TABS
325.0000 mg | ORAL_TABLET | Freq: Every day | ORAL | 0 refills | Status: AC
  Filled 2024-05-27: qty 100, 100d supply, fill #0

## 2024-06-03 ENCOUNTER — Other Ambulatory Visit (HOSPITAL_BASED_OUTPATIENT_CLINIC_OR_DEPARTMENT_OTHER): Payer: Self-pay

## 2024-06-05 ENCOUNTER — Encounter (HOSPITAL_COMMUNITY)

## 2024-06-24 ENCOUNTER — Encounter (HOSPITAL_COMMUNITY): Payer: Self-pay

## 2024-06-25 ENCOUNTER — Other Ambulatory Visit: Payer: Self-pay

## 2024-06-25 ENCOUNTER — Other Ambulatory Visit (HOSPITAL_BASED_OUTPATIENT_CLINIC_OR_DEPARTMENT_OTHER): Payer: Self-pay

## 2024-06-25 MED ORDER — OZEMPIC (1 MG/DOSE) 4 MG/3ML ~~LOC~~ SOPN
1.0000 mg | PEN_INJECTOR | SUBCUTANEOUS | 2 refills | Status: AC
  Filled 2024-06-25: qty 3, 28d supply, fill #0

## 2024-06-26 ENCOUNTER — Encounter (HOSPITAL_COMMUNITY)
Admission: RE | Admit: 2024-06-26 | Discharge: 2024-06-26 | Disposition: A | Discharge status: Home / Self Care | Source: Ambulatory Visit | Attending: Physician Assistant | Admitting: Physician Assistant

## 2024-06-26 DIAGNOSIS — R079 Chest pain, unspecified: Secondary | ICD-10-CM | POA: Insufficient documentation

## 2024-06-26 LAB — NM PET CT CARDIAC PERFUSION MULTI W/ABSOLUTE BLOODFLOW
MBFR: 2.24
Nuc Rest EF: 62 %
Nuc Stress EF: 68 %
Peak HR: 107 {beats}/min
Rest HR: 93 {beats}/min
Rest MBF: 1.41 ml/g/min
Rest Nuclear Isotope Dose: 26.5 mCi
ST Depression (mm): 0 mm
Stress MBF: 3.16 ml/g/min
Stress Nuclear Isotope Dose: 26.5 mCi
TID: 1.11

## 2024-06-26 MED ORDER — REGADENOSON 0.4 MG/5ML IV SOLN
INTRAVENOUS | Status: AC
  Filled 2024-06-26: qty 5

## 2024-06-26 MED ORDER — REGADENOSON 0.4 MG/5ML IV SOLN
0.4000 mg | Freq: Once | INTRAVENOUS | Status: AC
  Administered 2024-06-26: 0.4 mg via INTRAVENOUS

## 2024-06-26 MED ORDER — RUBIDIUM RB82 GENERATOR (RUBYFILL)
26.5400 | PACK | Freq: Once | INTRAVENOUS | Status: AC
  Administered 2024-06-26: 26.54 via INTRAVENOUS

## 2024-06-26 MED ORDER — RUBIDIUM RB82 GENERATOR (RUBYFILL)
26.5300 | PACK | Freq: Once | INTRAVENOUS | Status: AC
  Administered 2024-06-26: 26.53 via INTRAVENOUS

## 2024-06-27 ENCOUNTER — Ambulatory Visit: Payer: Self-pay | Admitting: Physician Assistant

## 2025-01-06 ENCOUNTER — Other Ambulatory Visit (HOSPITAL_BASED_OUTPATIENT_CLINIC_OR_DEPARTMENT_OTHER)
